# Patient Record
Sex: Female | Born: 1935 | Race: Black or African American | Hispanic: No | Smoking: Former smoker
Health system: Southern US, Community
[De-identification: ages and names within clinical notes are randomized; demographics above are authoritative.]

## PROBLEM LIST (undated history)

## (undated) DIAGNOSIS — E785 Hyperlipidemia, unspecified: Secondary | ICD-10-CM

## (undated) DIAGNOSIS — A159 Respiratory tuberculosis unspecified: Secondary | ICD-10-CM

## (undated) DIAGNOSIS — I219 Acute myocardial infarction, unspecified: Secondary | ICD-10-CM

## (undated) DIAGNOSIS — I10 Essential (primary) hypertension: Secondary | ICD-10-CM

## (undated) HISTORY — DX: Hyperlipidemia, unspecified: E78.5

## (undated) HISTORY — DX: Essential (primary) hypertension: I10

## (undated) HISTORY — PX: CARDIAC DEFIBRILLATOR PLACEMENT: SHX171

## (undated) HISTORY — DX: Respiratory tuberculosis unspecified: A15.9

## (undated) HISTORY — DX: Acute myocardial infarction, unspecified: I21.9

---

## 2000-02-20 HISTORY — PX: CORONARY ARTERY BYPASS GRAFT: SHX141

## 2010-11-09 ENCOUNTER — Encounter: Payer: Self-pay | Admitting: Internal Medicine

## 2010-11-09 ENCOUNTER — Ambulatory Visit (INDEPENDENT_AMBULATORY_CARE_PROVIDER_SITE_OTHER): Payer: Medicare Other | Admitting: Internal Medicine

## 2010-11-09 VITALS — BP 110/72 | HR 60 | Temp 97.8°F | Resp 12 | Ht 63.5 in | Wt 138.0 lb

## 2010-11-09 DIAGNOSIS — I2510 Atherosclerotic heart disease of native coronary artery without angina pectoris: Secondary | ICD-10-CM | POA: Insufficient documentation

## 2010-11-09 DIAGNOSIS — H04123 Dry eye syndrome of bilateral lacrimal glands: Secondary | ICD-10-CM

## 2010-11-09 DIAGNOSIS — H04129 Dry eye syndrome of unspecified lacrimal gland: Secondary | ICD-10-CM

## 2010-11-09 DIAGNOSIS — Z23 Encounter for immunization: Secondary | ICD-10-CM

## 2010-11-09 DIAGNOSIS — E785 Hyperlipidemia, unspecified: Secondary | ICD-10-CM | POA: Insufficient documentation

## 2010-11-09 DIAGNOSIS — I10 Essential (primary) hypertension: Secondary | ICD-10-CM | POA: Insufficient documentation

## 2010-11-09 DIAGNOSIS — Z0000 Encounter for general adult medical examination without abnormal findings: Secondary | ICD-10-CM

## 2010-11-09 NOTE — Progress Notes (Signed)
Subjective:    Patient ID: Sally Noble, female    DOB: August 10, 1923, 77 y.o.   MRN: [id number]  HPI Sally Noble is an 77 year old female with a history of hypertension, hyperlipidemia, and coronary artery disease who presents to establish care. She reports that she has been feeling well. She reports normal appetite and normal activity level. Her one concern today is drainage from both of her eyes. She reports it's been going on for several years. She has clear drainage from both of her eyes and decreased vision in her left eye. She recently moved to [place] would like to establish care with an ophthalmologist.  Outpatient Encounter Prescriptions as of 11/09/2010  Medication Sig Dispense Refill  . atorvastatin (LIPITOR) 80 MG tablet Take 80 mg by mouth daily.        Marland Kitchen FOLIC ACID PO Take 1 tablet by mouth daily.        Bernadette Hoit Sodium (SENNA PLUS PO) Take 1 tablet by mouth as needed.        Marland Kitchen acetaminophen-codeine (TYLENOL #3) 300-30 MG per tablet 1 tablet every 4 (four) hours as needed.       . donepezil (ARICEPT) 5 MG tablet Take 5 mg by mouth at bedtime.       . furosemide (LASIX) 40 MG tablet Take 40 mg by mouth daily.       Marland Kitchen lisinopril (PRINIVIL,ZESTRIL) 20 MG tablet Take 20 mg by mouth daily.       . metoprolol succinate (TOPROL-XL) 25 MG 24 hr tablet Take 25 mg by mouth daily.       Marland Kitchen NITROSTAT 0.4 MG SL tablet Place 0.4 mg under the tongue every 5 (five) minutes as needed.       Marland Kitchen omeprazole (PRILOSEC) 20 MG capsule Take 20 mg by mouth 2 (two) times daily.       Marland Kitchen spironolactone (ALDACTONE) 25 MG tablet Take 25 mg by mouth daily.         Review of Systems  Constitutional: Negative for fever, chills, appetite change, fatigue and unexpected weight change.  HENT: Negative for ear pain, congestion, sore throat, trouble swallowing, neck pain, voice change and sinus pressure.   Eyes: Positive for discharge and visual disturbance.  Respiratory: Negative for cough, shortness  of breath, wheezing and stridor.   Cardiovascular: Negative for chest pain, palpitations and leg swelling.  Gastrointestinal: Negative for nausea, vomiting, abdominal pain, diarrhea, constipation, blood in stool, abdominal distention and anal bleeding.  Genitourinary: Negative for dysuria and flank pain.  Musculoskeletal: Negative for myalgias, arthralgias and gait problem.  Skin: Negative for color change and rash.  Neurological: Negative for dizziness and headaches.  Hematological: Negative for adenopathy. Does not bruise/bleed easily.  Psychiatric/Behavioral: Negative for suicidal ideas, sleep disturbance and dysphoric mood. The patient is not nervous/anxious.    BP 110/72  Pulse 60  Temp(Src) 97.8 F (36.6 C) (Oral)  Resp 12  Ht 5' 3.5" (1.613 m)  Wt 138 lb (62.596 kg)  BMI 24.06 kg/m2  SpO2 98%     Objective:   Physical Exam  Constitutional: She is oriented to person, place, and time. She appears well-developed and well-nourished. No distress.  HENT:  Head: Normocephalic and atraumatic.  Right Ear: External ear normal.  Left Ear: External ear normal.  Nose: Nose normal.  Mouth/Throat: Oropharynx is clear and moist. No oropharyngeal exudate.  Eyes: Conjunctivae are normal. Right eye exhibits no discharge. Left eye exhibits discharge. No scleral icterus. Left eye exhibits abnormal extraocular  motion. Left pupil is not reactive. Pupils are unequal.    Neck: Normal range of motion. Neck supple. No tracheal deviation present. No thyromegaly present.  Cardiovascular: Normal rate, regular rhythm, normal heart sounds and intact distal pulses.  Exam reveals no gallop and no friction rub.   No murmur heard. Pulmonary/Chest: Effort normal and breath sounds normal. No respiratory distress. She has no wheezes. She has no rales. She exhibits no tenderness.  Abdominal: Soft. Bowel sounds are normal. She exhibits no distension and no mass. There is no tenderness. There is no rebound and no  guarding.  Musculoskeletal: Normal range of motion. She exhibits no edema and no tenderness.  Lymphadenopathy:    She has no cervical adenopathy.  Neurological: She is alert and oriented to person, place, and time. No cranial nerve deficit. She exhibits normal muscle tone. Coordination normal.  Skin: Skin is warm and dry. No rash noted. She is not diaphoretic. No erythema. No pallor.  Psychiatric: She has a normal mood and affect. Her behavior is normal. Judgment and thought content normal.          Assessment & Plan:  1. Eye Drainage and decreased vision in left eye - patient with bilateral clear eye drainage and decreased vision in her left eye. Exam is remarkable for nonreactive pupil on the left. Will request records on her history as to previous evaluation and treatment. She has no pain or photophobia to suggest corneal abrasion. Her conjunctiva are clear. Will set her up with ophthalmology for evaluation and management.  2. Hypertension - patient with history of hypertension. Blood pressure is well-controlled today. Will get records on previous treatment. We'll check renal function with labs today.  3. Hyperlipidemia - patient with history of hyperlipidemia on statin. Typically I would refrain from using a statin and the patient who is 77 years old. However, she reports extensive history of atherosclerosis and coronary artery disease. Will obtain her previous records on evaluation and management, prior to making a decision about long-term statin use. Will check lipids and LFTs with labs today.  4. Coronary artery disease -patient with coronary artery disease with a history of implantable defibrillator. Will obtain records on previous evaluation and management. Will continue current medications. Notably, she is not on aspirin or plavix. Unclear reason for this. Will review records, if no contraindiation, will start aspirin.

## 2010-11-10 ENCOUNTER — Encounter: Payer: Self-pay | Admitting: Internal Medicine

## 2010-11-10 LAB — CBC WITH DIFFERENTIAL/PLATELET
Basophils Relative: 0.5 % (ref 0.0–3.0)
Eosinophils Relative: 1.8 % (ref 0.0–5.0)
HCT: 43 % (ref 36.0–46.0)
Hemoglobin: 14.2 g/dL (ref 12.0–15.0)
Lymphocytes Relative: 18.7 % (ref 12.0–46.0)
Lymphs Abs: 1.2 10*3/uL (ref 0.7–4.0)
Monocytes Relative: 5.7 % (ref 3.0–12.0)
Neutro Abs: 4.9 10*3/uL (ref 1.4–7.7)
RBC: 4.84 Mil/uL (ref 3.87–5.11)
WBC: 6.7 10*3/uL (ref 4.5–10.5)

## 2010-11-10 LAB — HEPATIC FUNCTION PANEL
ALT: 15 U/L (ref 0–35)
Bilirubin, Direct: 0.1 mg/dL (ref 0.0–0.3)
Total Bilirubin: 0.4 mg/dL (ref 0.3–1.2)
Total Protein: 7.7 g/dL (ref 6.0–8.3)

## 2010-11-10 LAB — BASIC METABOLIC PANEL
BUN: 27 mg/dL — ABNORMAL HIGH (ref 6–23)
Calcium: 9.3 mg/dL (ref 8.4–10.5)
Chloride: 102 mEq/L (ref 96–112)
Creatinine, Ser: 0.9 mg/dL (ref 0.4–1.2)

## 2010-11-10 LAB — LIPID PANEL
Cholesterol: 160 mg/dL (ref 0–200)
HDL: 40 mg/dL (ref >39.00)
Total CHOL/HDL Ratio: 4
Triglycerides: 227 mg/dL — ABNORMAL HIGH (ref 0.0–149.0)

## 2010-11-10 NOTE — Progress Notes (Signed)
Addended by: Melody Comas L on: 11/10/2010 11:06 AM   Modules accepted: Orders

## 2010-11-10 NOTE — Progress Notes (Signed)
Addended by: Jobie Quaker on: 11/10/2010 10:56 AM   Modules accepted: Orders

## 2010-11-28 ENCOUNTER — Emergency Department: Payer: 59 | Admitting: Emergency Medicine

## 2010-12-01 ENCOUNTER — Encounter: Payer: Self-pay | Admitting: Internal Medicine

## 2011-01-05 ENCOUNTER — Telehealth: Payer: Self-pay | Admitting: Internal Medicine

## 2011-01-05 NOTE — Telephone Encounter (Signed)
Received message from [facility], that pt nose running. May start Claritin 10mg  daily. Follow up here if no improvement

## 2011-01-05 NOTE — Telephone Encounter (Signed)
Notified the [facility] and also faxed copy of this phone note to the [facility] at there request.

## 2011-01-10 ENCOUNTER — Ambulatory Visit: Payer: Medicare Other | Admitting: Internal Medicine

## 2011-01-15 ENCOUNTER — Ambulatory Visit: Payer: Medicare Other | Admitting: Internal Medicine

## 2011-03-08 ENCOUNTER — Encounter: Payer: Self-pay | Admitting: Cardiovascular Disease

## 2011-03-08 ENCOUNTER — Inpatient Hospital Stay: Payer: Self-pay | Admitting: Internal Medicine

## 2011-03-08 LAB — CBC
HGB: 11 g/dL — ABNORMAL LOW (ref 12.0–16.0)
MCH: 30.8 pg (ref 26.0–34.0)
MCHC: 33 g/dL (ref 32.0–36.0)
Platelet: 159 10*3/uL (ref 150–440)
RBC: 3.56 10*6/uL — ABNORMAL LOW (ref 3.80–5.20)
WBC: 6.4 10*3/uL (ref 3.6–11.0)

## 2011-03-08 LAB — COMPREHENSIVE METABOLIC PANEL
Albumin: 3.3 g/dL — ABNORMAL LOW (ref 3.4–5.0)
Calcium, Total: 8.3 mg/dL — ABNORMAL LOW (ref 8.5–10.1)
Co2: 19 mmol/L — ABNORMAL LOW (ref 21–32)
Glucose: 99 mg/dL (ref 65–99)
Osmolality: 313 (ref 275–301)
Potassium: 6.7 mmol/L (ref 3.5–5.1)
SGOT(AST): 20 U/L (ref 15–37)
Sodium: 141 mmol/L (ref 136–145)

## 2011-03-08 LAB — URINALYSIS, COMPLETE
Bilirubin,UR: NEGATIVE
Blood: NEGATIVE
Ketone: NEGATIVE
Leukocyte Esterase: NEGATIVE
Protein: NEGATIVE
Specific Gravity: 1.008 (ref 1.003–1.030)
Squamous Epithelial: 2
WBC UR: <1 /HPF (ref 0–5)

## 2011-03-08 LAB — CK TOTAL AND CKMB (NOT AT ARMC)
CK, Total: 61 U/L (ref 21–215)
CK-MB: 2.5 ng/mL (ref 0.5–3.6)

## 2011-03-09 ENCOUNTER — Encounter: Payer: Self-pay | Admitting: Cardiovascular Disease

## 2011-03-09 DIAGNOSIS — I059 Rheumatic mitral valve disease, unspecified: Secondary | ICD-10-CM

## 2011-03-09 DIAGNOSIS — R072 Precordial pain: Secondary | ICD-10-CM

## 2011-03-09 LAB — BASIC METABOLIC PANEL
Anion Gap: 10 (ref 7–16)
Anion Gap: 16 (ref 7–16)
BUN: 68 mg/dL — ABNORMAL HIGH (ref 7–18)
Calcium, Total: 8.2 mg/dL — ABNORMAL LOW (ref 8.5–10.1)
Calcium, Total: 7.7 mg/dL — ABNORMAL LOW (ref 8.5–10.1)
Co2: 20 mmol/L — ABNORMAL LOW (ref 21–32)
Co2: 17 mmol/L — ABNORMAL LOW (ref 21–32)
Creatinine: 2.04 mg/dL — ABNORMAL HIGH (ref 0.60–1.30)
Creatinine: 1.96 mg/dL — ABNORMAL HIGH (ref 0.60–1.30)
Creatinine: 1.77 mg/dL — ABNORMAL HIGH (ref 0.60–1.30)
EGFR (African American): 30 — ABNORMAL LOW
EGFR (African American): 31 — ABNORMAL LOW
EGFR (African American): 35 — ABNORMAL LOW
EGFR (Non-African Amer.): 25 — ABNORMAL LOW
EGFR (Non-African Amer.): 26 — ABNORMAL LOW
EGFR (Non-African Amer.): 29 — ABNORMAL LOW
Glucose: 141 mg/dL — ABNORMAL HIGH (ref 65–99)
Osmolality: 308 (ref 275–301)
Potassium: 4 mmol/L (ref 3.5–5.1)
Sodium: 150 mmol/L — ABNORMAL HIGH (ref 136–145)
Sodium: 145 mmol/L (ref 136–145)

## 2011-03-09 LAB — CBC WITH DIFFERENTIAL/PLATELET
Basophil %: 0 %
Eosinophil #: 0 10*3/uL (ref 0.0–0.7)
Eosinophil %: 0.3 %
HGB: 10.8 g/dL — ABNORMAL LOW (ref 12.0–16.0)
MCH: 31.1 pg (ref 26.0–34.0)
MCHC: 33.3 g/dL (ref 32.0–36.0)
Monocyte #: 0.1 10*3/uL (ref 0.0–0.7)
Neutrophil #: 9 10*3/uL — ABNORMAL HIGH (ref 1.4–6.5)
Neutrophil %: 96.3 %
Platelet: 151 10*3/uL (ref 150–440)

## 2011-03-09 LAB — IRON AND TIBC
Iron Bind.Cap.(Total): 248 ug/dL — ABNORMAL LOW (ref 250–450)
Iron Saturation: 27 %
Iron: 67 ug/dL (ref 50–170)
Unbound Iron-Bind.Cap.: 181 ug/dL

## 2011-03-09 LAB — APTT: Activated PTT: 36 secs — ABNORMAL HIGH (ref 23.6–35.9)

## 2011-03-09 LAB — LIPID PANEL
Cholesterol: 115 mg/dL (ref 0–200)
Ldl Cholesterol, Calc: 63 mg/dL (ref 0–100)
Triglycerides: 135 mg/dL (ref 0–200)

## 2011-03-09 LAB — CK TOTAL AND CKMB (NOT AT ARMC): CK, Total: 59 U/L (ref 21–215)

## 2011-03-09 LAB — MAGNESIUM: Magnesium: 1.4 mg/dL — ABNORMAL LOW

## 2011-03-09 LAB — FERRITIN: Ferritin (ARMC): 394 ng/mL — ABNORMAL HIGH (ref 8–388)

## 2011-03-09 LAB — HEMOGLOBIN A1C: Hemoglobin A1C: 6.5 % — ABNORMAL HIGH (ref 4.2–6.3)

## 2011-03-10 ENCOUNTER — Encounter: Payer: Self-pay | Admitting: Cardiovascular Disease

## 2011-03-10 LAB — RENAL FUNCTION PANEL
Albumin: 2.4 g/dL — ABNORMAL LOW (ref 3.4–5.0)
Anion Gap: 13 (ref 7–16)
BUN: 38 mg/dL — ABNORMAL HIGH (ref 7–18)
Calcium, Total: 7.2 mg/dL — ABNORMAL LOW (ref 8.5–10.1)
Chloride: 113 mmol/L — ABNORMAL HIGH (ref 98–107)
Co2: 19 mmol/L — ABNORMAL LOW (ref 21–32)
Creatinine: 1.37 mg/dL — ABNORMAL HIGH (ref 0.60–1.30)
EGFR (African American): 47 — ABNORMAL LOW
Osmolality: 302 (ref 275–301)
Potassium: 3.2 mmol/L — ABNORMAL LOW (ref 3.5–5.1)

## 2011-03-10 LAB — BASIC METABOLIC PANEL
Co2: 21 mmol/L (ref 21–32)
Creatinine: 1.18 mg/dL (ref 0.60–1.30)
EGFR (African American): 56 — ABNORMAL LOW
EGFR (Non-African Amer.): 46 — ABNORMAL LOW
Glucose: 250 mg/dL — ABNORMAL HIGH (ref 65–99)
Sodium: 141 mmol/L (ref 136–145)

## 2011-03-10 LAB — APTT: Activated PTT: >160 secs (ref 23.6–35.9)

## 2011-03-11 ENCOUNTER — Encounter: Payer: Self-pay | Admitting: Cardiovascular Disease

## 2011-03-11 LAB — RENAL FUNCTION PANEL
Albumin: 2.2 g/dL — ABNORMAL LOW (ref 3.4–5.0)
Chloride: 111 mmol/L — ABNORMAL HIGH (ref 98–107)
Co2: 21 mmol/L (ref 21–32)
Creatinine: 1.06 mg/dL (ref 0.60–1.30)
EGFR (Non-African Amer.): 52 — ABNORMAL LOW
Phosphorus: 1.9 mg/dL — ABNORMAL LOW (ref 2.5–4.9)

## 2011-03-12 LAB — TSH: Thyroid Stimulating Horm: 1.44 u[IU]/mL

## 2011-03-12 LAB — BASIC METABOLIC PANEL
Anion Gap: 10 (ref 7–16)
BUN: 14 mg/dL (ref 7–18)
Calcium, Total: 8.1 mg/dL — ABNORMAL LOW (ref 8.5–10.1)
Creatinine: 0.8 mg/dL (ref 0.60–1.30)
EGFR (African American): >60
EGFR (Non-African Amer.): >60
Glucose: 85 mg/dL (ref 65–99)
Osmolality: 288 (ref 275–301)
Potassium: 3.8 mmol/L (ref 3.5–5.1)

## 2011-03-13 LAB — BASIC METABOLIC PANEL
Anion Gap: 11 (ref 7–16)
BUN: 11 mg/dL (ref 7–18)
Chloride: 111 mmol/L — ABNORMAL HIGH (ref 98–107)
Co2: 26 mmol/L (ref 21–32)
Creatinine: 0.87 mg/dL (ref 0.60–1.30)
EGFR (African American): >60
EGFR (Non-African Amer.): >60
Osmolality: 293 (ref 275–301)
Sodium: 148 mmol/L — ABNORMAL HIGH (ref 136–145)

## 2011-03-13 LAB — PLATELET COUNT: Platelet: 158 10*3/uL (ref 150–440)

## 2011-03-13 LAB — CULTURE, BLOOD (SINGLE)

## 2011-03-14 ENCOUNTER — Telehealth: Payer: Self-pay | Admitting: *Deleted

## 2011-03-14 NOTE — Telephone Encounter (Signed)
Pt was d/c'd from hospital yesterday. She was admitted for primary c/o CP. Pt is scheduled for OV for f/u Monday the 28th. The Sally Noble is aware that it is important to keep f/u and will call office w/any problems.

## 2011-03-19 ENCOUNTER — Ambulatory Visit (INDEPENDENT_AMBULATORY_CARE_PROVIDER_SITE_OTHER): Payer: Medicare Other | Admitting: Internal Medicine

## 2011-03-19 ENCOUNTER — Encounter: Payer: Self-pay | Admitting: Internal Medicine

## 2011-03-19 VITALS — BP 120/76 | HR 81 | Temp 98.2°F | Ht 63.5 in | Wt 139.0 lb

## 2011-03-19 DIAGNOSIS — I10 Essential (primary) hypertension: Secondary | ICD-10-CM

## 2011-03-19 DIAGNOSIS — I2510 Atherosclerotic heart disease of native coronary artery without angina pectoris: Secondary | ICD-10-CM

## 2011-03-19 DIAGNOSIS — R197 Diarrhea, unspecified: Secondary | ICD-10-CM | POA: Insufficient documentation

## 2011-03-19 NOTE — Assessment & Plan Note (Signed)
Likely secondary to use of senna. Patient reports that symptoms have resolved. We'll continue to monitor. Followup in one month.

## 2011-03-19 NOTE — Assessment & Plan Note (Signed)
Blood pressure well-controlled today. Medication changes made in hospital noted. We'll continue to monitor closely. Followup here in one month.

## 2011-03-19 NOTE — Progress Notes (Signed)
Subjective:    Patient ID: Sally Noble, female    DOB: March 07, 1923, 78 y.o.   MRN: [id number]  HPI 78 year old female with history of coronary artery disease, hypertension, hyperlipidemia presents for followup after recent hospitalization for chest pain and diarrhea. During her hospitalization her medications were changed and she was taken off beta blocker and Spironolactone. Lasix was also stopped. She was evaluated for chest pain, however workup was negative for any new myocardial infarction. She was also evaluated for diarrhea and dehydration. Her senna was stopped. She had significant improvement in her diarrhea. She reports that since hospital discharge, she has had no further episodes of chest pain, shortness of breath, or palpitations. She also reports no further episodes of diarrhea. She reports that she has had 3 bowel movements since her discharge which she describes as normal. She denies any abdominal pain. She denies any blood in her stool. She denies any fever or chills. She reports that she is generally feeling well.  Outpatient Encounter Prescriptions as of 03/19/2011  Medication Sig Dispense Refill  . acetaminophen-codeine (TYLENOL #3) 300-30 MG per tablet 1 tablet every 4 (four) hours as needed.       Marland Kitchen atorvastatin (LIPITOR) 80 MG tablet Take 80 mg by mouth daily.        Marland Kitchen donepezil (ARICEPT) 5 MG tablet Take 5 mg by mouth at bedtime.       Marland Kitchen NITROSTAT 0.4 MG SL tablet Place 0.4 mg under the tongue every 5 (five) minutes as needed.       Marland Kitchen omeprazole (PRILOSEC) 20 MG capsule Take 20 mg by mouth 2 (two) times daily.       Marland Kitchen DISCONTD: FOLIC ACID PO Take 1 tablet by mouth daily.        Marland Kitchen lisinopril (PRINIVIL,ZESTRIL) 20 MG tablet Take 20 mg by mouth daily.         Review of Systems  Constitutional: Negative for fever, chills, appetite change, fatigue and unexpected weight change.  HENT: Negative for ear pain, congestion, sore throat, trouble swallowing, neck pain, voice change and  sinus pressure.   Eyes: Negative for visual disturbance.  Respiratory: Negative for cough, shortness of breath, wheezing and stridor.   Cardiovascular: Negative for chest pain, palpitations and leg swelling.  Gastrointestinal: Negative for nausea, vomiting, abdominal pain, diarrhea, constipation, blood in stool, abdominal distention and anal bleeding.  Genitourinary: Negative for dysuria and flank pain.  Musculoskeletal: Negative for myalgias, arthralgias and gait problem.  Skin: Negative for color change and rash.  Neurological: Negative for dizziness and headaches.  Hematological: Negative for adenopathy. Does not bruise/bleed easily.  Psychiatric/Behavioral: Negative for suicidal ideas, sleep disturbance and dysphoric mood. The patient is not nervous/anxious.    BP 120/76  Pulse 81  Temp(Src) 98.2 F (36.8 C) (Oral)  Ht 5' 3.5" (1.613 m)  Wt 139 lb (63.05 kg)  BMI 24.24 kg/m2  SpO2 98%     Objective:   Physical Exam  Constitutional: She is oriented to person, place, and time. She appears well-developed and well-nourished. No distress.  HENT:  Head: Normocephalic and atraumatic.  Right Ear: External ear normal.  Left Ear: External ear normal.  Nose: Nose normal.  Mouth/Throat: Oropharynx is clear and moist. No oropharyngeal exudate.  Eyes: Conjunctivae are normal. Pupils are equal, round, and reactive to light. Right eye exhibits no discharge. Left eye exhibits no discharge. No scleral icterus.  Neck: Normal range of motion. Neck supple. No tracheal deviation present. No thyromegaly present.  Cardiovascular: Normal  rate, regular rhythm, normal heart sounds and intact distal pulses.  Exam reveals no gallop and no friction rub.   No murmur heard. Pulmonary/Chest: Effort normal and breath sounds normal. No respiratory distress. She has no wheezes. She has no rales. She exhibits no tenderness.  Musculoskeletal: Normal range of motion. She exhibits no edema and no tenderness.    Lymphadenopathy:    She has no cervical adenopathy.  Neurological: She is alert and oriented to person, place, and time. No cranial nerve deficit. She exhibits normal muscle tone. Coordination normal.  Skin: Skin is warm and dry. No rash noted. She is not diaphoretic. No erythema. No pallor.  Psychiatric: She has a normal mood and affect. Her behavior is normal. Judgment and thought content normal.          Assessment & Plan:

## 2011-03-19 NOTE — Assessment & Plan Note (Signed)
Status post recent hospital admission for chest pain. Will request recent discharge summary. Patient has been chest pain-free since hospital discharge. We'll continue to monitor closely. Plan to continue current medications. Followup here in one month.

## 2011-03-21 ENCOUNTER — Telehealth: Payer: Self-pay | Admitting: Internal Medicine

## 2011-03-21 NOTE — Telephone Encounter (Addendum)
Viviann Spare called from [facility] needing an order for this patients therapy . He also called to let us now that the patient has had a 10 lb. Weight gain since January 16,2013 her blood pressure was reading 138/90 and her ankles were swollen . Dr.Walker O' kd the Rehab and a follow up appointment was scheduled for February 1,2013 @ 10:45 with Dr. Dan Humphreys. An order was faxed to [facility] to give the patient Lasix 20 mg once a day over 2 days per Dr. Dan Humphreys. Patient informed Viviann Spare that her diarrhea has stopped since she was taken off the medication.

## 2011-03-23 ENCOUNTER — Ambulatory Visit (INDEPENDENT_AMBULATORY_CARE_PROVIDER_SITE_OTHER): Payer: Medicare Other | Admitting: Internal Medicine

## 2011-03-23 ENCOUNTER — Encounter: Payer: Self-pay | Admitting: Internal Medicine

## 2011-03-23 VITALS — BP 122/78 | HR 65 | Temp 97.9°F | Wt 135.0 lb

## 2011-03-23 DIAGNOSIS — I509 Heart failure, unspecified: Secondary | ICD-10-CM

## 2011-03-23 LAB — BASIC METABOLIC PANEL
BUN: 12 mg/dL (ref 6–23)
CO2: 27 mEq/L (ref 19–32)
Calcium: 8.8 mg/dL (ref 8.4–10.5)
Chloride: 107 mEq/L (ref 96–112)
Creatinine, Ser: 1 mg/dL (ref 0.4–1.2)
GFR: 69.35 mL/min (ref >60.00)
Glucose, Bld: 99 mg/dL (ref 70–99)
Potassium: 3.6 mEq/L (ref 3.5–5.1)
Sodium: 142 mEq/L (ref 135–145)

## 2011-03-23 NOTE — Progress Notes (Signed)
Subjective:    Patient ID: Sally Noble, female    DOB: [birth year], 78 y.o.   MRN: [id number]  HPI 78 year old female with a history of congestive heart failure status post recent admission for dehydration and diarrhea presents for followup. She was recently seen on 03/18/2010. She was doing very well at time. She was then undergoing physical therapy session on Wednesday, 03/20/2010 when she was noted to have approximately 10 pound weight gain and pitting edema in her lower extremities. Physical therapist called our office and we started her back on Lasix 20 mg daily for 2 days. She reports improvement in her lower extremity swelling with this medication. She denies any shortness of breath, chest pain, or other complaints.  Outpatient Encounter Prescriptions as of 03/23/2011  Medication Sig Dispense Refill  . acetaminophen-codeine (TYLENOL #3) 300-30 MG per tablet 1 tablet every 4 (four) hours as needed.       Marland Kitchen atorvastatin (LIPITOR) 80 MG tablet Take 80 mg by mouth daily.        Marland Kitchen donepezil (ARICEPT) 5 MG tablet Take 5 mg by mouth at bedtime.       Marland Kitchen lisinopril (PRINIVIL,ZESTRIL) 20 MG tablet Take 20 mg by mouth daily.       Marland Kitchen NITROSTAT 0.4 MG SL tablet Place 0.4 mg under the tongue every 5 (five) minutes as needed.       Marland Kitchen omeprazole (PRILOSEC) 20 MG capsule Take 20 mg by mouth 2 (two) times daily.         Review of Systems  Constitutional: Negative for fever, chills, appetite change, fatigue and unexpected weight change.  HENT: Negative for ear pain, congestion, sore throat, trouble swallowing, neck pain, voice change and sinus pressure.   Eyes: Negative for visual disturbance.  Respiratory: Negative for cough, shortness of breath, wheezing and stridor.   Cardiovascular: Positive for leg swelling. Negative for chest pain and palpitations.  Gastrointestinal: Negative for nausea, vomiting, abdominal pain, diarrhea, constipation, blood in stool, abdominal distention and anal bleeding.    Genitourinary: Negative for dysuria and flank pain.  Musculoskeletal: Negative for myalgias, arthralgias and gait problem.  Skin: Negative for color change and rash.  Neurological: Negative for dizziness and headaches.  Hematological: Negative for adenopathy. Does not bruise/bleed easily.  Psychiatric/Behavioral: Negative for suicidal ideas, sleep disturbance and dysphoric mood. The patient is not nervous/anxious.    BP 122/78  Pulse 65  Temp(Src) 97.9 F (36.6 C) (Oral)  Wt 135 lb (61.236 kg)  SpO2 97%     Objective:   Physical Exam  Constitutional: She is oriented to person, place, and time. She appears well-developed and well-nourished. No distress.  HENT:  Head: Normocephalic and atraumatic.  Right Ear: External ear normal.  Left Ear: External ear normal.  Nose: Nose normal.  Mouth/Throat: Oropharynx is clear and moist. No oropharyngeal exudate.  Eyes: Conjunctivae are normal. Pupils are equal, round, and reactive to light. Right eye exhibits no discharge. Left eye exhibits no discharge. No scleral icterus.  Neck: Normal range of motion. Neck supple. No tracheal deviation present. No thyromegaly present.  Cardiovascular: Normal rate, regular rhythm, normal heart sounds and intact distal pulses.  Exam reveals no gallop and no friction rub.   No murmur heard. Pulmonary/Chest: Effort normal. No respiratory distress. She has no wheezes. She has rales (few at bilateral bases). She exhibits no tenderness.  Musculoskeletal: Normal range of motion. She exhibits edema (1+ to mid lower leg). She exhibits no tenderness.  Lymphadenopathy:    She has  no cervical adenopathy.  Neurological: She is alert and oriented to person, place, and time. No cranial nerve deficit. She exhibits normal muscle tone. Coordination normal.  Skin: Skin is warm and dry. No rash noted. She is not diaphoretic. No erythema. No pallor.  Psychiatric: She has a normal mood and affect. Her behavior is normal. Judgment  and thought content normal.          Assessment & Plan:

## 2011-03-23 NOTE — Assessment & Plan Note (Signed)
Concern was raised by PT earlier this week about 10 pound weight gain and lower extremity edema. However, based on our records patient's weight is stable and now down by 4 pounds since Monday. She has minimal edema in her lower extremities. She reports this is at her baseline. I reviewed her records from her recent admission at [facility]. At that time, she was noted to be dehydrated with acute renal insufficiency. She also had hyperkalemia. She was taken off her spironolactone and her Lasix. We discussed the option of resuming her Lasix on a daily basis versus continuing to monitor her weight and lower extremity edema and using as needed. She would prefer to use as needed at this time. Will check renal function and electrolytes with labs today. She has followup with cardiology next week. She will followup here in one month.

## 2011-03-23 NOTE — Patient Instructions (Signed)
Please monitor weight and blood pressure daily.  If weight increases >3lb in 24hr, please notify us.  If BP consistently >140/90, please notify us.  Follow up 1 month.

## 2011-03-28 ENCOUNTER — Encounter: Payer: Self-pay | Admitting: Cardiovascular Disease

## 2011-03-28 ENCOUNTER — Ambulatory Visit (INDEPENDENT_AMBULATORY_CARE_PROVIDER_SITE_OTHER): Payer: Medicare Other | Admitting: Cardiovascular Disease

## 2011-03-28 VITALS — BP 144/89 | HR 73 | Ht 63.0 in | Wt 133.0 lb

## 2011-03-28 DIAGNOSIS — E785 Hyperlipidemia, unspecified: Secondary | ICD-10-CM

## 2011-03-28 DIAGNOSIS — I10 Essential (primary) hypertension: Secondary | ICD-10-CM

## 2011-03-28 DIAGNOSIS — I2510 Atherosclerotic heart disease of native coronary artery without angina pectoris: Secondary | ICD-10-CM

## 2011-03-28 DIAGNOSIS — I509 Heart failure, unspecified: Secondary | ICD-10-CM

## 2011-03-28 DIAGNOSIS — Z95810 Presence of automatic (implantable) cardiac defibrillator: Secondary | ICD-10-CM

## 2011-03-28 DIAGNOSIS — R197 Diarrhea, unspecified: Secondary | ICD-10-CM

## 2011-03-28 NOTE — Assessment & Plan Note (Signed)
We will try to get her established in the [facility] cardiology office for ICD evaluation.

## 2011-03-28 NOTE — Assessment & Plan Note (Signed)
She reports her diarrhea has resolved. I suggested she call our office or Dr. Dan Humphreys recur.

## 2011-03-28 NOTE — Assessment & Plan Note (Signed)
Blood pressure mildly elevated. We'll start the lisinopril and low-dose carvedilol as above.

## 2011-03-28 NOTE — Assessment & Plan Note (Signed)
We'll continue her Lipitor.

## 2011-03-28 NOTE — Progress Notes (Signed)
Patient ID: Sally Noble, female    DOB: 1924-01-22, 78 y.o.   MRN: [id number]  HPI Comments: Ms. Dangerfield is an 78 year old woman with history of coronary artery disease, bypass in 2002, LIMA to the LAD, vein graft to the PDA, vein graft to the OM with cardiac catheterization August 2012 with occluded vein graft to the OM, ejection fraction estimated at 15-20%, history of dementia, hypertension, stroke, ICD who has been a resident of the [facility] in [place] since July 2012, Who presented to the hospital March 09, 2011 with chest pain, dehydration, diarrhea.  She reported having a long period of diarrhea. Other residents at the next or having diarrhea per her report. Creatinine was greater than 3. Her cardiac medications were held including diuretic, beta blocker, ACE inhibitor and spironolactone. She was hydrated, diarrhea resolved and she was discharged with followup. She did appear in the hospital to be depressed and was not eating or drinking much. She reports now that she is drinking normally and diarrhea has resolved. Creatinine had significantly improved at the time of discharge.  She is asking whether she can go home to her house in [place]. She would like to drive again. She denies any shortness of breath or chest pain. No further diarrhea.  EKG shows normal sinus rhythm with rate 73 beats per minute with poor R wave progression to the anterior precordial leads, T-wave abnormality in one and aVL   Outpatient Encounter Prescriptions as of 03/28/2011  Medication Sig Dispense Refill  . acetaminophen (TYLENOL) 500 MG tablet Take 500 mg by mouth 3 (three) times daily as needed.      Marland Kitchen aspirin EC 81 MG tablet Take 81 mg by mouth daily.      Marland Kitchen atorvastatin (LIPITOR) 80 MG tablet Take 80 mg by mouth daily.        Marland Kitchen donepezil (ARICEPT) 5 MG tablet Take 5 mg by mouth at bedtime.       . folic acid (FOLVITE) 400 MCG tablet Take 400 mcg by mouth daily.      Marland Kitchen loperamide (IMODIUM A-D) 2 MG tablet  Take 2 mg by mouth every 8 (eight) hours as needed.      Marland Kitchen NITROSTAT 0.4 MG SL tablet Place 0.4 mg under the tongue every 5 (five) minutes as needed.       Marland Kitchen omeprazole (PRILOSEC) 20 MG capsule Take 20 mg by mouth 2 (two) times daily.       . carvedilol (COREG) 3.125 MG tablet Take 1 tablet (3.125 mg total) by mouth 2 (two) times daily.  60 tablet  11  . furosemide (LASIX) 20 MG tablet Take 1 tablet (20 mg total) by mouth daily.  30 tablet  11  . lisinopril (PRINIVIL,ZESTRIL) 20 MG tablet Take 1 tablet (20 mg total) by mouth daily.  30 tablet  11  . spironolactone (ALDACTONE) 25 MG tablet Take 1 tablet (25 mg total) by mouth daily.        Review of Systems  Constitutional: Negative.   HENT: Negative.   Eyes: Negative.   Respiratory: Negative.   Cardiovascular: Negative.   Gastrointestinal: Negative.   Musculoskeletal: Negative.   Skin: Negative.   Neurological: Negative.   Hematological: Negative.   Psychiatric/Behavioral: Negative.   All other systems reviewed and are negative.      Physical Exam  Nursing note and vitals reviewed. Constitutional: She is oriented to person, place, and time. She appears well-developed and well-nourished.  HENT:  Head: Normocephalic.  Nose: Nose  normal.  Mouth/Throat: Oropharynx is clear and moist.  Eyes: Conjunctivae are normal. Pupils are equal, round, and reactive to light.  Neck: Normal range of motion. Neck supple. No JVD present.  Cardiovascular: Normal rate, regular rhythm, S1 normal, S2 normal and intact distal pulses.  Exam reveals no gallop and no friction rub.   Murmur heard.  Crescendo systolic murmur is present with a grade of 2/6  Pulmonary/Chest: Effort normal and breath sounds normal. No respiratory distress. She has no wheezes. She has no rales. She exhibits no tenderness.  Abdominal: Soft. Bowel sounds are normal. She exhibits no distension. There is no tenderness.  Musculoskeletal: Normal range of motion. She exhibits no  edema and no tenderness.  Lymphadenopathy:    She has no cervical adenopathy.  Neurological: She is alert and oriented to person, place, and time. Coordination normal.  Skin: Skin is warm and dry. No rash noted. No erythema.  Psychiatric: She has a normal mood and affect. Her behavior is normal. Judgment and thought content normal.         Assessment and Plan

## 2011-03-28 NOTE — Assessment & Plan Note (Signed)
Currently with no symptoms of angina. No further workup at this time. Continue current medication regimen. Underlying ischemic cardiomyopathy 

## 2011-03-28 NOTE — Patient Instructions (Addendum)
Please start coreg 3.125 mg po BID Restart lisinopril 20 mg daily Start furosemide 20 mg daily Restart spironolactone 25 mg daily  Continue your other medications  Please call us if you have new issues that need to be addressed before your next appt.  Your physician wants you to follow-up in: 3  To 4 weeks

## 2011-03-28 NOTE — Assessment & Plan Note (Signed)
History of ischemic cardiomyopathy,  Ejection fraction recently in the hospital was 35%. All of her medications were held and we will restart lisinopril 20 mg daily, Lasix 20 mg daily, Coreg 3.125 mg b.i.d. And spironolactone 25 mg daily. She does have an ICD and her carvedilol can be titrated upwards as blood pressure tolerates. The diuretic can also be titrated upwards. As needed. she was previously on 40 mg daily.

## 2011-03-30 ENCOUNTER — Telehealth: Payer: Self-pay | Admitting: Cardiovascular Disease

## 2011-03-30 ENCOUNTER — Telehealth: Payer: Self-pay | Admitting: *Deleted

## 2011-03-30 NOTE — Telephone Encounter (Signed)
2.8.13--pharmacy calling asking about folic acid and asa 81mg --advised pt saw dr Mariah Milling on 2/6 and he makes no mention of d/cing asa or folic acid so I TOLD PHARMACIST TO CONTINUE TO INCLUDE THESE MEDS ON HER MED LIST AND CONTINUE PROVIDING THEM--PHARMACIST AGREES--NT

## 2011-03-30 NOTE — Telephone Encounter (Signed)
Nursing facility has questions concerning her folic acid and ASA. Please call to advise

## 2011-04-10 ENCOUNTER — Telehealth: Payer: Self-pay

## 2011-04-10 NOTE — Telephone Encounter (Signed)
The patient's daughter(Brenda Rose) states the patient has her ICD checked with Dr. Joneen Roach @ [facility] in [place]. She is having ICD checked on April 24, 2011.  She had ICD checked by Dr. Julio Alm since ICD placed. The daughter would like to have the patient continue her care with Dr.Deen (cardiologist) @ [facility] in [place]. The daughter was not sure why her mother had been changed to Dr. Mariah Milling in the first place and that Dr.Deen's office is closer for the family to commute her back and forth. The daughter is the power of attorney for the patient.

## 2011-04-17 ENCOUNTER — Encounter: Payer: Self-pay | Admitting: *Deleted

## 2011-04-18 ENCOUNTER — Ambulatory Visit: Payer: Medicare Other | Admitting: Cardiovascular Disease

## 2011-09-04 ENCOUNTER — Encounter: Payer: Self-pay | Admitting: Internal Medicine

## 2011-09-04 ENCOUNTER — Ambulatory Visit (INDEPENDENT_AMBULATORY_CARE_PROVIDER_SITE_OTHER): Payer: Medicare Other | Admitting: Internal Medicine

## 2011-09-04 VITALS — BP 110/70 | HR 64 | Temp 98.2°F | Ht 63.5 in | Wt 130.8 lb

## 2011-09-04 DIAGNOSIS — I509 Heart failure, unspecified: Secondary | ICD-10-CM

## 2011-09-04 DIAGNOSIS — R252 Cramp and spasm: Secondary | ICD-10-CM

## 2011-09-04 DIAGNOSIS — D510 Vitamin B12 deficiency anemia due to intrinsic factor deficiency: Secondary | ICD-10-CM

## 2011-09-04 DIAGNOSIS — R350 Frequency of micturition: Secondary | ICD-10-CM | POA: Insufficient documentation

## 2011-09-04 DIAGNOSIS — E039 Hypothyroidism, unspecified: Secondary | ICD-10-CM

## 2011-09-04 DIAGNOSIS — R42 Dizziness and giddiness: Secondary | ICD-10-CM

## 2011-09-04 NOTE — Assessment & Plan Note (Signed)
Suspect this may be related to use of diuretics. Will check electrolytes, renal function, thyroid function, and B12 with labs today.

## 2011-09-04 NOTE — Assessment & Plan Note (Signed)
Patient reports recent admission for congestive heart failure. Will request records on this. Per previous notes, ejection fraction on echo was 35%. However this was in February 2013. For now, we'll continue current medications including lisinopril, spironolacton, Lasix, and carvedilol. Will check electrolytes and renal function with labs today. Followup in 2 weeks.

## 2011-09-04 NOTE — Progress Notes (Signed)
Subjective:    Patient ID: Sally Noble, female    DOB: May 28, 1923, 78 y.o.   MRN: [id number]  HPI 78 year old female with history of congestive heart failure presents for followup. She reports she was recently admitted to the hospital for CHF exacerbation. Records at this hospital admission are not available at the time of her visit. Since her hospitalization, she has been living at a local assisted-living facility. She is concerned about her care there. She reports that the staff seem to lack of knowledge about her medications. She is not sure she is taking the right medicines. She also notes they have not been checking her weight. She reports that she feels dizzy on occasion especially with standing. This is described as lightheadedness, not vertigo. She has not had syncope. She also feels fatigued. She denies shortness of breath or chest pain. She notes intermittent cramping in her lower extremities with "drawing up "of her feet. She reports that no lab work has been performed over the last couple of weeks.  Outpatient Encounter Prescriptions as of 09/04/2011  Medication Sig Dispense Refill  . acetaminophen (TYLENOL) 500 MG tablet Take 500 mg by mouth 3 (three) times daily as needed.      Marland Kitchen aspirin EC 81 MG tablet Take 81 mg by mouth daily.      Marland Kitchen atorvastatin (LIPITOR) 80 MG tablet Take 80 mg by mouth daily.        . carvedilol (COREG) 3.125 MG tablet Take 1 tablet (3.125 mg total) by mouth 2 (two) times daily.  60 tablet  11  . donepezil (ARICEPT) 5 MG tablet Take 5 mg by mouth at bedtime.       . folic acid (FOLVITE) 400 MCG tablet Take 400 mcg by mouth daily.      . furosemide (LASIX) 20 MG tablet Take 1 tablet (20 mg total) by mouth daily.  30 tablet  11  . lisinopril (PRINIVIL,ZESTRIL) 20 MG tablet Take 1 tablet (20 mg total) by mouth daily.  30 tablet  11  . NITROSTAT 0.4 MG SL tablet Place 0.4 mg under the tongue every 5 (five) minutes as needed.       Marland Kitchen omeprazole (PRILOSEC) 20 MG  capsule Take 20 mg by mouth 2 (two) times daily.       Marland Kitchen spironolactone (ALDACTONE) 25 MG tablet Take 1 tablet (25 mg total) by mouth daily.      Marland Kitchen loperamide (IMODIUM A-D) 2 MG tablet Take 2 mg by mouth every 8 (eight) hours as needed.        Review of Systems  Constitutional: Negative for fever, chills, appetite change, fatigue and unexpected weight change.  HENT: Negative for ear pain, congestion, sore throat, trouble swallowing, neck pain, voice change and sinus pressure.   Eyes: Negative for visual disturbance.  Respiratory: Negative for cough, shortness of breath, wheezing and stridor.   Cardiovascular: Negative for chest pain, palpitations and leg swelling.  Gastrointestinal: Negative for nausea, vomiting, abdominal pain, diarrhea, constipation, blood in stool, abdominal distention and anal bleeding.  Genitourinary: Negative for dysuria and flank pain.  Musculoskeletal: Positive for myalgias. Negative for arthralgias and gait problem.  Skin: Negative for color change and rash.  Neurological: Positive for dizziness, weakness and light-headedness. Negative for headaches.  Hematological: Negative for adenopathy. Does not bruise/bleed easily.  Psychiatric/Behavioral: Negative for suicidal ideas, disturbed wake/sleep cycle and dysphoric mood. The patient is not nervous/anxious.    BP 110/70  Pulse 64  Temp 98.2 F (36.8 C) (Oral)  Ht 5' 3.5" (1.613 m)  Wt 130 lb 12 oz (59.308 kg)  BMI 22.80 kg/m2  SpO2 96%     Objective:   Physical Exam  Constitutional: She is oriented to person, place, and time. She appears well-developed and well-nourished. No distress.  HENT:  Head: Normocephalic and atraumatic.  Right Ear: External ear normal.  Left Ear: External ear normal.  Nose: Nose normal.  Mouth/Throat: Oropharynx is clear and moist. No oropharyngeal exudate.  Eyes: Conjunctivae are normal. Pupils are equal, round, and reactive to light. Right eye exhibits no discharge. Left eye  exhibits no discharge. No scleral icterus.  Neck: Normal range of motion. Neck supple. No tracheal deviation present. No thyromegaly present.  Cardiovascular: Normal rate, regular rhythm, normal heart sounds and intact distal pulses.  Exam reveals no gallop and no friction rub.   No murmur heard. Pulmonary/Chest: Effort normal and breath sounds normal. No respiratory distress. She has no wheezes. She has no rales. She exhibits no tenderness.  Musculoskeletal: Normal range of motion. She exhibits no edema and no tenderness.  Lymphadenopathy:    She has no cervical adenopathy.  Neurological: She is alert and oriented to person, place, and time. No cranial nerve deficit. She exhibits normal muscle tone. Coordination normal.  Skin: Skin is warm and dry. No rash noted. She is not diaphoretic. No erythema. No pallor.  Psychiatric: She has a normal mood and affect. Her behavior is normal. Judgment and thought content normal.          Assessment & Plan:

## 2011-09-04 NOTE — Assessment & Plan Note (Signed)
Likely related to use of diuretics. Will check urinalysis with labs today.

## 2011-09-04 NOTE — Assessment & Plan Note (Signed)
Question if this may be related to electrolyte imbalance. Will check CMP, TSH, magnesium with labs today.

## 2011-09-06 LAB — POCT URINALYSIS DIPSTICK
Bilirubin, UA: NEGATIVE
Blood, UA: NEGATIVE
Glucose, UA: NEGATIVE
Nitrite, UA: NEGATIVE
Spec Grav, UA: 1.01

## 2011-09-12 ENCOUNTER — Other Ambulatory Visit: Payer: Self-pay | Admitting: Internal Medicine

## 2011-09-12 NOTE — Telephone Encounter (Signed)
Dr walker ordered labs on this patient after her visit last week.  She appears dehydrated an dappears to have a UTI.  Have her stop the daily furosemide and change it to every other day starting tomorrow,  And call in cipro 250 mg one tablet twice daily a 5 dys  #10 no refills.  She is a nursing home patient so these will need to be written out.

## 2011-09-13 MED ORDER — CIPROFLOXACIN HCL 250 MG PO TABS: 250.0000 mg | ORAL_TABLET | Freq: Two times a day (BID) | ORAL | Status: AC

## 2011-09-13 NOTE — Telephone Encounter (Signed)
Nursing home advised. Rx printed and faxed to [id number].

## 2011-09-25 ENCOUNTER — Ambulatory Visit: Payer: Medicare Other | Admitting: Internal Medicine

## 2011-09-26 ENCOUNTER — Encounter: Payer: Self-pay | Admitting: Internal Medicine

## 2011-09-26 ENCOUNTER — Ambulatory Visit (INDEPENDENT_AMBULATORY_CARE_PROVIDER_SITE_OTHER): Payer: Medicare Other | Admitting: Internal Medicine

## 2011-09-26 VITALS — BP 128/76 | HR 60 | Temp 97.6°F | Ht 63.5 in | Wt 131.2 lb

## 2011-09-26 DIAGNOSIS — I509 Heart failure, unspecified: Secondary | ICD-10-CM

## 2011-09-26 DIAGNOSIS — R42 Dizziness and giddiness: Secondary | ICD-10-CM

## 2011-09-26 DIAGNOSIS — I10 Essential (primary) hypertension: Secondary | ICD-10-CM

## 2011-09-26 LAB — COMPREHENSIVE METABOLIC PANEL
ALT: 15 U/L (ref 0–35)
Alkaline Phosphatase: 61 U/L (ref 39–117)
Sodium: 138 mEq/L (ref 135–145)
Total Bilirubin: 0.9 mg/dL (ref 0.3–1.2)
Total Protein: 7.5 g/dL (ref 6.0–8.3)

## 2011-09-26 NOTE — Progress Notes (Signed)
Subjective:    Patient ID: Sally Noble, female    DOB: [birth year], 78 y.o.   MRN: [id number]  HPI 78 year old female with history of congestive heart failure, hypertension, dementia presents for followup. She reports that she is generally feeling well. She does continue to have some episodes of lightheadedness when she stands quickly. However, in general she denies any persistent lightheadedness, chest pain, shortness of breath, lower extremity edema. We had tried to change her Lasix to as needed but she reports that the staff at her facility have been giving this medication to her daily.    Outpatient Encounter Prescriptions as of 09/26/2011  Medication Sig Dispense Refill  . acetaminophen (TYLENOL) 325 MG tablet Take 325 mg by mouth every 4 (four) hours as needed.      Marland Kitchen aspirin EC 81 MG tablet Take 81 mg by mouth daily.      Marland Kitchen atorvastatin (LIPITOR) 80 MG tablet Take 80 mg by mouth daily.        . carvedilol (COREG) 3.125 MG tablet Take 1 tablet (3.125 mg total) by mouth 2 (two) times daily.  60 tablet  11  . ciprofloxacin (CIPRO) 250 MG tablet Take 250 mg by mouth 2 (two) times daily.      Marland Kitchen donepezil (ARICEPT) 5 MG tablet Take 5 mg by mouth at bedtime.       . folic acid (FOLVITE) 400 MCG tablet Take 400 mcg by mouth daily.      . furosemide (LASIX) 20 MG tablet Take 1 tablet (20 mg total) by mouth daily.  30 tablet  11  . lisinopril (PRINIVIL,ZESTRIL) 20 MG tablet Take 1 tablet (20 mg total) by mouth daily.  30 tablet  11  . loperamide (IMODIUM A-D) 2 MG tablet Take 2 mg by mouth every 8 (eight) hours as needed.      Marland Kitchen NITROSTAT 0.4 MG SL tablet Place 0.4 mg under the tongue every 5 (five) minutes as needed.       Marland Kitchen omeprazole (PRILOSEC) 20 MG capsule Take 20 mg by mouth 2 (two) times daily.       Marland Kitchen spironolactone (ALDACTONE) 25 MG tablet Take 1 tablet (25 mg total) by mouth daily.      Marland Kitchen DISCONTD: acetaminophen (TYLENOL) 500 MG tablet Take 500 mg by mouth 3 (three) times daily as  needed.        Review of Systems  Constitutional: Negative for fever, chills, appetite change, fatigue and unexpected weight change.  HENT: Negative for ear pain, congestion, sore throat, trouble swallowing, neck pain, voice change and sinus pressure.   Eyes: Negative for visual disturbance.  Respiratory: Negative for cough, shortness of breath, wheezing and stridor.   Cardiovascular: Negative for chest pain, palpitations and leg swelling.  Gastrointestinal: Negative for nausea, vomiting, abdominal pain, diarrhea, constipation, blood in stool, abdominal distention and anal bleeding.  Genitourinary: Negative for dysuria and flank pain.  Musculoskeletal: Negative for myalgias, arthralgias and gait problem.  Skin: Negative for color change and rash.  Neurological: Positive for light-headedness. Negative for dizziness and headaches.  Hematological: Negative for adenopathy. Does not bruise/bleed easily.  Psychiatric/Behavioral: Negative for suicidal ideas, disturbed wake/sleep cycle and dysphoric mood. The patient is not nervous/anxious.    BP 128/76  Pulse 60  Temp 97.6 F (36.4 C) (Oral)  Ht 5' 3.5" (1.613 m)  Wt 131 lb 4 oz (59.535 kg)  BMI 22.89 kg/m2  SpO2 96%     Objective:   Physical Exam  Constitutional: She is  oriented to person, place, and time. She appears well-developed and well-nourished. No distress.  HENT:  Head: Normocephalic and atraumatic.  Right Ear: External ear normal.  Left Ear: External ear normal.  Nose: Nose normal.  Mouth/Throat: Oropharynx is clear and moist. No oropharyngeal exudate.  Eyes: Conjunctivae are normal. Pupils are equal, round, and reactive to light. Right eye exhibits no discharge. Left eye exhibits no discharge. No scleral icterus.  Neck: Normal range of motion. Neck supple. No tracheal deviation present. No thyromegaly present.  Cardiovascular: Normal rate, regular rhythm, normal heart sounds and intact distal pulses.  Exam reveals no gallop  and no friction rub.   No murmur heard. Pulmonary/Chest: Effort normal and breath sounds normal. No respiratory distress. She has no wheezes. She has no rales. She exhibits no tenderness.  Musculoskeletal: Normal range of motion. She exhibits no edema and no tenderness.  Lymphadenopathy:    She has no cervical adenopathy.  Neurological: She is alert and oriented to person, place, and time. No cranial nerve deficit. She exhibits normal muscle tone. Coordination normal.  Skin: Skin is warm and dry. No rash noted. She is not diaphoretic. No erythema. No pallor.  Psychiatric: She has a normal mood and affect. Her behavior is normal. Judgment and thought content normal.          Assessment & Plan:

## 2011-09-26 NOTE — Assessment & Plan Note (Signed)
Persistent lightheadedness with standing secondary to orthostatic hypotension and dehydration. As above, will hold Lasix for now. Repeat BMP in one week.

## 2011-09-26 NOTE — Assessment & Plan Note (Signed)
Symptoms well controlled. Labs today showed elevated BUN and creatinine consistent with dehydration. Given that her nursing facility has been unable to administer her Lasix as needed based on changes in weight, we'll have them stop this medication. Will continue spironolactone, carvedilol. Encouraged her to call if she feels that her weight is increasing or she develops lower extremity edema suggesting a need for Lasix. We'll plan to repeat BMP in one week.

## 2011-09-28 ENCOUNTER — Encounter: Payer: Self-pay | Admitting: *Deleted

## 2011-10-10 ENCOUNTER — Ambulatory Visit: Payer: Medicare Other | Admitting: Internal Medicine

## 2011-10-11 ENCOUNTER — Other Ambulatory Visit: Payer: Medicare Other

## 2011-10-27 ENCOUNTER — Emergency Department: Payer: Self-pay | Admitting: Emergency Medicine

## 2011-10-27 LAB — COMPREHENSIVE METABOLIC PANEL
Albumin: 3.6 g/dL (ref 3.4–5.0)
Alkaline Phosphatase: 87 U/L (ref 50–136)
Bilirubin,Total: 0.6 mg/dL (ref 0.2–1.0)
Calcium, Total: 9.2 mg/dL (ref 8.5–10.1)
Chloride: 109 mmol/L — ABNORMAL HIGH (ref 98–107)
Co2: 25 mmol/L (ref 21–32)
Creatinine: 0.98 mg/dL (ref 0.60–1.30)
Osmolality: 284 (ref 275–301)
SGOT(AST): 19 U/L (ref 15–37)
SGPT (ALT): 16 U/L (ref 12–78)

## 2011-10-27 LAB — CBC WITH DIFFERENTIAL/PLATELET
Eosinophil #: 0 10*3/uL (ref 0.0–0.7)
Eosinophil %: 0.3 %
HCT: 31.6 % — ABNORMAL LOW (ref 35.0–47.0)
HGB: 10.6 g/dL — ABNORMAL LOW (ref 12.0–16.0)
Lymphocyte #: 0.7 10*3/uL — ABNORMAL LOW (ref 1.0–3.6)
MCV: 93 fL (ref 80–100)
Monocyte #: 0.2 x10 3/mm (ref 0.2–0.9)
RBC: 3.4 10*6/uL — ABNORMAL LOW (ref 3.80–5.20)
WBC: 7.5 10*3/uL (ref 3.6–11.0)

## 2011-11-15 ENCOUNTER — Telehealth: Payer: Self-pay | Admitting: *Deleted

## 2011-11-15 NOTE — Telephone Encounter (Signed)
Facility is requesting a written Rx for Meclizine 25mg .  This medication is not currently on med list.  Please advise.

## 2011-11-15 NOTE — Telephone Encounter (Signed)
Fine to fill Meclizine 25mg  po tid prn, #30 no refill

## 2011-11-19 ENCOUNTER — Other Ambulatory Visit: Payer: Self-pay

## 2011-11-19 NOTE — Telephone Encounter (Signed)
Spoke to [place] at [facility] and gave her verbal order for Meclizine 25 mg #30 0R

## 2011-11-22 ENCOUNTER — Ambulatory Visit (INDEPENDENT_AMBULATORY_CARE_PROVIDER_SITE_OTHER): Payer: Medicare Other | Admitting: Internal Medicine

## 2011-11-22 ENCOUNTER — Encounter: Payer: Self-pay | Admitting: Internal Medicine

## 2011-11-22 VITALS — BP 122/84 | HR 60 | Temp 98.1°F | Ht 63.5 in | Wt 138.5 lb

## 2011-11-22 DIAGNOSIS — R252 Cramp and spasm: Secondary | ICD-10-CM

## 2011-11-22 NOTE — Assessment & Plan Note (Signed)
Suspect cramping in the calves may be secondary to electrolyte imbalance or dehydration. Will check CMP, CBC, TSH, B12, and magnesium level with labs today. ([facility]) we'll also set up with vascular evaluation with AP eyes and arterial Dopplers. Patient has significant risk of peripheral vascular disease. Exam today, however, is normal except for trace edema and few superficial varicosities noted.

## 2011-11-22 NOTE — Progress Notes (Signed)
Subjective:    Patient ID: Sally Noble, female    DOB: 1923/11/07, 78 y.o.   MRN: [id number]  HPI 78 year old female with history of hypertension, congestive heart failure, and hyperlipidemia presents for acute visit complaining of cramping in her calves which is most prominent at night. This is been ongoing for several weeks. The cramping and pain often wakes her from sleep. Pain is alleviated after a few minutes without intervention. Patient denies swelling in her legs, trauma to her legs, discoloration of her legs. She denies any recent shortness of breath, chest pain.  Outpatient Encounter Prescriptions as of 11/22/2011  Medication Sig Dispense Refill  . acetaminophen (TYLENOL) 325 MG tablet Take 325 mg by mouth every 4 (four) hours as needed.      Marland Kitchen aspirin EC 81 MG tablet Take 81 mg by mouth daily.      Marland Kitchen atorvastatin (LIPITOR) 80 MG tablet Take 80 mg by mouth daily.        . carvedilol (COREG) 3.125 MG tablet Take 1 tablet (3.125 mg total) by mouth 2 (two) times daily.  60 tablet  11  . donepezil (ARICEPT) 5 MG tablet Take 5 mg by mouth at bedtime.       . folic acid (FOLVITE) 400 MCG tablet Take 400 mcg by mouth daily.      Marland Kitchen lisinopril (PRINIVIL,ZESTRIL) 20 MG tablet Take 1 tablet (20 mg total) by mouth daily.  30 tablet  11  . meclizine (ANTIVERT) 25 MG tablet Take 25 mg by mouth 3 (three) times daily as needed.      Marland Kitchen NITROSTAT 0.4 MG SL tablet Place 0.4 mg under the tongue every 5 (five) minutes as needed.       Marland Kitchen omeprazole (PRILOSEC) 20 MG capsule Take 20 mg by mouth 2 (two) times daily.       Marland Kitchen spironolactone (ALDACTONE) 25 MG tablet Take 1 tablet (25 mg total) by mouth daily.      . furosemide (LASIX) 20 MG tablet Take 1 tablet (20 mg total) by mouth daily.  30 tablet  11  . loperamide (IMODIUM A-D) 2 MG tablet Take 2 mg by mouth every 8 (eight) hours as needed.      Marland Kitchen DISCONTD: ciprofloxacin (CIPRO) 250 MG tablet Take 250 mg by mouth 2 (two) times daily.       BP 122/84   Pulse 60  Temp 98.1 F (36.7 C) (Oral)  Ht 5' 3.5" (1.613 m)  Wt 138 lb 8 oz (62.823 kg)  BMI 24.15 kg/m2  SpO2 98%  Review of Systems  Constitutional: Negative for fever, chills, appetite change, fatigue and unexpected weight change.  Eyes: Negative for visual disturbance.  Respiratory: Negative for cough and shortness of breath.   Cardiovascular: Negative for chest pain, palpitations and leg swelling.  Gastrointestinal: Negative for abdominal pain and abdominal distention.  Genitourinary: Negative for dysuria and flank pain.  Musculoskeletal: Positive for myalgias. Negative for arthralgias and gait problem.  Skin: Negative for color change and rash.  Neurological: Negative for dizziness and headaches.  Hematological: Negative for adenopathy. Does not bruise/bleed easily.  Psychiatric/Behavioral: Negative for suicidal ideas, disturbed wake/sleep cycle and dysphoric mood. The patient is not nervous/anxious.        Objective:   Physical Exam  Constitutional: She is oriented to person, place, and time. She appears well-developed and well-nourished. No distress.  HENT:  Head: Normocephalic and atraumatic.  Right Ear: External ear normal.  Left Ear: External ear normal.  Nose: Nose  normal.  Mouth/Throat: Oropharynx is clear and moist. No oropharyngeal exudate.  Eyes: Conjunctivae normal are normal. Pupils are equal, round, and reactive to light. Right eye exhibits no discharge. Left eye exhibits no discharge. No scleral icterus.  Neck: Normal range of motion. Neck supple. No tracheal deviation present. No thyromegaly present.  Cardiovascular: Normal rate, regular rhythm, normal heart sounds and intact distal pulses.  Exam reveals no gallop and no friction rub.   No murmur heard. Pulmonary/Chest: Effort normal and breath sounds normal. No respiratory distress. She has no wheezes. She has no rales. She exhibits no tenderness.  Musculoskeletal: Normal range of motion. She exhibits edema  (trace bilateral LE). She exhibits no tenderness.  Lymphadenopathy:    She has no cervical adenopathy.  Neurological: She is alert and oriented to person, place, and time. No cranial nerve deficit. She exhibits normal muscle tone. Coordination normal.  Skin: Skin is warm and dry. No rash noted. She is not diaphoretic. No erythema. No pallor.  Psychiatric: She has a normal mood and affect. Her behavior is normal. Judgment and thought content normal.          Assessment & Plan:

## 2011-12-07 ENCOUNTER — Encounter: Payer: Self-pay | Admitting: Internal Medicine

## 2011-12-07 ENCOUNTER — Telehealth: Payer: Self-pay | Admitting: Internal Medicine

## 2011-12-07 ENCOUNTER — Ambulatory Visit (INDEPENDENT_AMBULATORY_CARE_PROVIDER_SITE_OTHER): Payer: Medicare Other | Admitting: Internal Medicine

## 2011-12-07 VITALS — BP 118/62 | HR 60 | Temp 98.4°F | Ht 63.5 in | Wt 134.5 lb

## 2011-12-07 DIAGNOSIS — J40 Bronchitis, not specified as acute or chronic: Secondary | ICD-10-CM

## 2011-12-07 MED ORDER — AMOXICILLIN-POT CLAVULANATE 500-125 MG PO TABS: 1.0000 | ORAL_TABLET | Freq: Two times a day (BID) | ORAL | Status: DC

## 2011-12-07 NOTE — Assessment & Plan Note (Addendum)
Symptoms and exam are most consistent with viral upper respiratory infection and perhaps early bronchitis. Will treat with Augmentin. Will start Mucinex and Claritin prn. Patient will followup next week for recheck. If no improvement, would favor getting chest x-ray. Testing for influenza was negative today.

## 2011-12-07 NOTE — Telephone Encounter (Signed)
Caller: Christa/Other; Patient Name: Sally Noble; PCP: Ronna Polio (Adults only); [facility] Callback Phone Number: [phone] Afebrile Temp- 96.5  Nurse Foy Guadalajara is calling from [facility] [address] They called last night concerning pt cough and congestion. Per on call doctor they were to call today for an appointment. Pt has productive cough with green phlegm and congestion.  Emergent s/s of Cough protocol r/o. Pt to see provider within 4hrs. No appointment seen. Message sent.

## 2011-12-07 NOTE — Telephone Encounter (Signed)
I have already talked to [facility].  They are going to bring her over this pm.  Can put her in 2:45 thanks.

## 2011-12-07 NOTE — Progress Notes (Signed)
Subjective:    Patient ID: Sally Noble, female    DOB: 1923/06/23, 78 y.o.   MRN: [id number]  HPI 78 year old female with history of cardiomyopathy status post implantable defibrillator presents for acute visit complaining of one-week history of clear nasal congestion, cough. She denies any chest pain, fever, chills. She does occasionally have shortness of breath which she describes as mild. She has not been taking any medication for her symptoms.  Outpatient Encounter Prescriptions as of 12/07/2011  Medication Sig Dispense Refill  . acetaminophen (TYLENOL) 325 MG tablet Take 325 mg by mouth every 4 (four) hours as needed.      Marland Kitchen aspirin EC 81 MG tablet Take 81 mg by mouth daily.      Marland Kitchen atorvastatin (LIPITOR) 80 MG tablet Take 80 mg by mouth daily.        . carvedilol (COREG) 3.125 MG tablet Take 1 tablet (3.125 mg total) by mouth 2 (two) times daily.  60 tablet  11  . donepezil (ARICEPT) 5 MG tablet Take 5 mg by mouth at bedtime.       . folic acid (FOLVITE) 400 MCG tablet Take 400 mcg by mouth daily.      . furosemide (LASIX) 20 MG tablet Take 1 tablet (20 mg total) by mouth daily.  30 tablet  11  . lisinopril (PRINIVIL,ZESTRIL) 20 MG tablet Take 1 tablet (20 mg total) by mouth daily.  30 tablet  11  . loperamide (IMODIUM A-D) 2 MG tablet Take 2 mg by mouth every 8 (eight) hours as needed.      . meclizine (ANTIVERT) 25 MG tablet Take 25 mg by mouth 3 (three) times daily as needed.      Marland Kitchen NITROSTAT 0.4 MG SL tablet Place 0.4 mg under the tongue every 5 (five) minutes as needed.       Marland Kitchen omeprazole (PRILOSEC) 20 MG capsule Take 20 mg by mouth 2 (two) times daily.       Marland Kitchen spironolactone (ALDACTONE) 25 MG tablet Take 1 tablet (25 mg total) by mouth daily.      Marland Kitchen amoxicillin-clavulanate (AUGMENTIN) 500-125 MG per tablet Take 1 tablet (500 mg total) by mouth 2 (two) times daily.  20 tablet  0   BP 118/62  Pulse 60  Temp 98.4 F (36.9 C) (Oral)  Ht 5' 3.5" (1.613 m)  Wt 134 lb 8 oz (61.009  kg)  BMI 23.45 kg/m2  SpO2 98%  Review of Systems  Constitutional: Positive for fatigue. Negative for fever, chills, appetite change and unexpected weight change.  HENT: Positive for congestion, rhinorrhea and postnasal drip. Negative for ear pain, sore throat, trouble swallowing, neck pain, voice change and sinus pressure.   Eyes: Negative for visual disturbance.  Respiratory: Positive for cough and shortness of breath. Negative for wheezing and stridor.   Cardiovascular: Negative for chest pain, palpitations and leg swelling.  Gastrointestinal: Negative for nausea, vomiting, abdominal pain, diarrhea, constipation, blood in stool, abdominal distention and anal bleeding.  Genitourinary: Negative for dysuria and flank pain.  Musculoskeletal: Negative for myalgias, arthralgias and gait problem.  Skin: Negative for color change and rash.  Neurological: Negative for dizziness and headaches.  Hematological: Negative for adenopathy. Does not bruise/bleed easily.  Psychiatric/Behavioral: Negative for suicidal ideas, disturbed wake/sleep cycle and dysphoric mood. The patient is not nervous/anxious.        Objective:   Physical Exam  Constitutional: She is oriented to person, place, and time. She appears well-developed and well-nourished. No distress.  HENT:  Head: Normocephalic and atraumatic.  Right Ear: Tympanic membrane and external ear normal.  Left Ear: Tympanic membrane and external ear normal.  Nose: Nose normal.  Mouth/Throat: Oropharynx is clear and moist. No oropharyngeal exudate.  Eyes: Conjunctivae normal are normal. Pupils are equal, round, and reactive to light. Right eye exhibits no discharge. Left eye exhibits no discharge. No scleral icterus.  Neck: Normal range of motion. Neck supple. No tracheal deviation present. No thyromegaly present.  Cardiovascular: Normal rate, regular rhythm, normal heart sounds and intact distal pulses.  Exam reveals no gallop and no friction rub.     No murmur heard. Pulmonary/Chest: Effort normal. No accessory muscle usage. Not tachypneic. No respiratory distress. She has no decreased breath sounds. She has no wheezes. She has rhonchi (few scattered). She has no rales. She exhibits no tenderness.  Musculoskeletal: Normal range of motion. She exhibits no edema and no tenderness.  Lymphadenopathy:    She has no cervical adenopathy.  Neurological: She is alert and oriented to person, place, and time. No cranial nerve deficit. She exhibits normal muscle tone. Coordination normal.  Skin: Skin is warm and dry. No rash noted. She is not diaphoretic. No erythema. No pallor.  Psychiatric: She has a normal mood and affect. Her behavior is normal. Judgment and thought content normal.          Assessment & Plan:

## 2011-12-07 NOTE — Telephone Encounter (Signed)
I talked to [facility]. They are bringing her over.  Can put her in the 2:45 slot.  Thanks.

## 2011-12-07 NOTE — Telephone Encounter (Signed)
Can we ask Dr. Lorin Picket if she can work her in?  Charlene - would you mind seeing her?

## 2011-12-07 NOTE — Telephone Encounter (Signed)
Has appointment with dr walker 10/18

## 2011-12-11 ENCOUNTER — Ambulatory Visit: Payer: Medicare Other | Admitting: Internal Medicine

## 2011-12-20 ENCOUNTER — Ambulatory Visit (INDEPENDENT_AMBULATORY_CARE_PROVIDER_SITE_OTHER): Payer: Medicare Other | Admitting: Internal Medicine

## 2011-12-20 ENCOUNTER — Encounter: Payer: Self-pay | Admitting: Internal Medicine

## 2011-12-20 VITALS — BP 110/74 | HR 67 | Temp 98.6°F | Ht 63.5 in | Wt 134.5 lb

## 2011-12-20 DIAGNOSIS — J40 Bronchitis, not specified as acute or chronic: Secondary | ICD-10-CM

## 2011-12-20 DIAGNOSIS — Z23 Encounter for immunization: Secondary | ICD-10-CM

## 2011-12-20 NOTE — Progress Notes (Signed)
Subjective:    Patient ID: Sally Noble, female    DOB: 02-20-1923, 78 y.o.   MRN: [id number]  HPI 78 year old female with history of congestive heart failure, hypertension, hyperlipidemia presents for followup after recent episode of bronchitis. She reports she is feeling much better. She denies any ongoing cough, shortness of breath, fever, chills, chest pain. She reports mild fatigue but also notes that this is improving. She reports good appetite. She denies any new concerns today.  Outpatient Encounter Prescriptions as of 12/20/2011  Medication Sig Dispense Refill  . acetaminophen (TYLENOL) 325 MG tablet Take 325 mg by mouth every 4 (four) hours as needed.      Marland Kitchen amoxicillin-clavulanate (AUGMENTIN) 500-125 MG per tablet Take 1 tablet (500 mg total) by mouth 2 (two) times daily.  20 tablet  0  . aspirin EC 81 MG tablet Take 81 mg by mouth daily.      Marland Kitchen atorvastatin (LIPITOR) 80 MG tablet Take 80 mg by mouth daily.        . carvedilol (COREG) 3.125 MG tablet Take 1 tablet (3.125 mg total) by mouth 2 (two) times daily.  60 tablet  11  . donepezil (ARICEPT) 5 MG tablet Take 5 mg by mouth at bedtime.       . folic acid (FOLVITE) 400 MCG tablet Take 400 mcg by mouth daily.      . furosemide (LASIX) 20 MG tablet Take 1 tablet (20 mg total) by mouth daily.  30 tablet  11  . lisinopril (PRINIVIL,ZESTRIL) 20 MG tablet Take 1 tablet (20 mg total) by mouth daily.  30 tablet  11  . loperamide (IMODIUM A-D) 2 MG tablet Take 2 mg by mouth every 8 (eight) hours as needed.      . meclizine (ANTIVERT) 25 MG tablet Take 25 mg by mouth 3 (three) times daily as needed.      Marland Kitchen NITROSTAT 0.4 MG SL tablet Place 0.4 mg under the tongue every 5 (five) minutes as needed.       Marland Kitchen omeprazole (PRILOSEC) 20 MG capsule Take 20 mg by mouth 2 (two) times daily.       Marland Kitchen spironolactone (ALDACTONE) 25 MG tablet Take 1 tablet (25 mg total) by mouth daily.       BP 110/74  Pulse 67  Temp 98.6 F (37 C) (Oral)  Ht 5' 3.5"  (1.613 m)  Wt 134 lb 8 oz (61.009 kg)  BMI 23.45 kg/m2  SpO2 97%  Review of Systems  Constitutional: Positive for fatigue. Negative for fever, chills, appetite change and unexpected weight change.  HENT: Negative for ear pain, congestion, sore throat, trouble swallowing, neck pain, voice change and sinus pressure.   Eyes: Negative for visual disturbance.  Respiratory: Negative for cough, shortness of breath, wheezing and stridor.   Cardiovascular: Negative for chest pain, palpitations and leg swelling.  Gastrointestinal: Negative for nausea, vomiting, abdominal pain, diarrhea, constipation, blood in stool, abdominal distention and anal bleeding.  Genitourinary: Negative for dysuria and flank pain.  Musculoskeletal: Negative for myalgias, arthralgias and gait problem.  Skin: Negative for color change and rash.  Neurological: Negative for dizziness and headaches.  Hematological: Negative for adenopathy. Does not bruise/bleed easily.  Psychiatric/Behavioral: Negative for suicidal ideas, disturbed wake/sleep cycle and dysphoric mood. The patient is not nervous/anxious.        Objective:   Physical Exam  Constitutional: She is oriented to person, place, and time. She appears well-developed and well-nourished. No distress.  HENT:  Head: Normocephalic  and atraumatic.  Right Ear: External ear normal.  Left Ear: External ear normal.  Nose: Nose normal.  Mouth/Throat: Oropharynx is clear and moist. No oropharyngeal exudate.  Eyes: Conjunctivae normal are normal. Pupils are equal, round, and reactive to light. Right eye exhibits no discharge. Left eye exhibits no discharge. No scleral icterus.  Neck: Normal range of motion. Neck supple. No tracheal deviation present. No thyromegaly present.  Cardiovascular: Normal rate, regular rhythm, normal heart sounds and intact distal pulses.  Exam reveals no gallop and no friction rub.   No murmur heard. Pulmonary/Chest: Effort normal and breath sounds  normal. No accessory muscle usage. Not tachypneic. No respiratory distress. She has no decreased breath sounds. She has no wheezes. She has no rhonchi. She has no rales. She exhibits no tenderness.  Musculoskeletal: Normal range of motion. She exhibits no edema and no tenderness.  Lymphadenopathy:    She has no cervical adenopathy.  Neurological: She is alert and oriented to person, place, and time. No cranial nerve deficit. She exhibits normal muscle tone. Coordination normal.  Skin: Skin is warm and dry. No rash noted. She is not diaphoretic. No erythema. No pallor.  Psychiatric: She has a normal mood and affect. Her behavior is normal. Judgment and thought content normal.          Assessment & Plan:

## 2011-12-20 NOTE — Assessment & Plan Note (Signed)
Symptoms markedly improved. Will continue to monitor. Patient will call if any recurrence of symptoms. Otherwise followup 3 months.

## 2011-12-31 ENCOUNTER — Ambulatory Visit: Payer: Medicare Other | Admitting: Internal Medicine

## 2012-03-03 ENCOUNTER — Telehealth: Payer: Self-pay | Admitting: Internal Medicine

## 2012-03-03 NOTE — Telephone Encounter (Signed)
Pt called stating she need dr walkers permission to leave the [facility]. Pt wants to go home

## 2012-03-03 NOTE — Telephone Encounter (Signed)
Left message on machine at home for Steward Drone (patients daughter) to return call.

## 2012-03-03 NOTE — Telephone Encounter (Signed)
We should probably set up a visit to discuss this. Will need to discuss how she will care for herself at home.

## 2012-03-04 NOTE — Telephone Encounter (Signed)
Spoke with patient and advised her as instructed, she stated that she will have her transportation call our office to schedule an appt with Dr. Dan Humphreys.

## 2012-03-17 ENCOUNTER — Ambulatory Visit (INDEPENDENT_AMBULATORY_CARE_PROVIDER_SITE_OTHER): Payer: Medicare Other | Admitting: Internal Medicine

## 2012-03-17 ENCOUNTER — Encounter: Payer: Self-pay | Admitting: Internal Medicine

## 2012-03-17 VITALS — BP 140/78 | HR 62 | Temp 98.0°F | Ht 63.0 in | Wt 139.0 lb

## 2012-03-17 DIAGNOSIS — Z598 Other problems related to housing and economic circumstances: Secondary | ICD-10-CM

## 2012-03-17 DIAGNOSIS — D649 Anemia, unspecified: Secondary | ICD-10-CM

## 2012-03-17 DIAGNOSIS — I509 Heart failure, unspecified: Secondary | ICD-10-CM

## 2012-03-17 DIAGNOSIS — E785 Hyperlipidemia, unspecified: Secondary | ICD-10-CM

## 2012-03-17 DIAGNOSIS — I10 Essential (primary) hypertension: Secondary | ICD-10-CM

## 2012-03-17 LAB — CBC WITH DIFFERENTIAL/PLATELET
Basophils Absolute: 0 10*3/uL (ref 0.0–0.1)
Eosinophils Absolute: 0.1 10*3/uL (ref 0.0–0.7)
Hemoglobin: 10.1 g/dL — ABNORMAL LOW (ref 12.0–15.0)
Lymphocytes Relative: 22.7 % (ref 12.0–46.0)
MCHC: 33.2 g/dL (ref 30.0–36.0)
Monocytes Relative: 8.6 % (ref 3.0–12.0)
Neutrophils Relative %: 66.4 % (ref 43.0–77.0)
Platelets: 190 10*3/uL (ref 150.0–400.0)
RDW: 16.1 % — ABNORMAL HIGH (ref 11.5–14.6)

## 2012-03-17 LAB — LIPID PANEL
Cholesterol: 129 mg/dL (ref 0–200)
HDL: 39.3 mg/dL (ref >39.00)
Triglycerides: 90 mg/dL (ref 0.0–149.0)
VLDL: 18 mg/dL (ref 0.0–40.0)

## 2012-03-17 LAB — COMPREHENSIVE METABOLIC PANEL
ALT: 12 U/L (ref 0–35)
AST: 16 U/L (ref 0–37)
Albumin: 3.7 g/dL (ref 3.5–5.2)
CO2: 26 mEq/L (ref 19–32)
Calcium: 9.1 mg/dL (ref 8.4–10.5)
Chloride: 107 mEq/L (ref 96–112)
Creatinine, Ser: 1.2 mg/dL (ref 0.4–1.2)
GFR: 57.53 mL/min — ABNORMAL LOW (ref >60.00)
Potassium: 4.7 mEq/L (ref 3.5–5.1)

## 2012-03-17 MED ORDER — OMEPRAZOLE 20 MG PO CPDR: 20.0000 mg | DELAYED_RELEASE_CAPSULE | Freq: Two times a day (BID) | ORAL | Status: AC

## 2012-03-17 MED ORDER — FUROSEMIDE 20 MG PO TABS: 20.0000 mg | ORAL_TABLET | Freq: Every day | ORAL | Status: AC

## 2012-03-17 MED ORDER — CARVEDILOL 6.25 MG PO TABS: 6.2500 mg | ORAL_TABLET | Freq: Two times a day (BID) | ORAL | Status: AC

## 2012-03-17 MED ORDER — ATORVASTATIN CALCIUM 80 MG PO TABS: 80.0000 mg | ORAL_TABLET | Freq: Every day | ORAL | Status: AC

## 2012-03-17 MED ORDER — DONEPEZIL HCL 5 MG PO TABS: 5.0000 mg | ORAL_TABLET | Freq: Every day | ORAL | Status: AC

## 2012-03-17 MED ORDER — NITROGLYCERIN 0.4 MG SL SUBL: 0.4000 mg | SUBLINGUAL_TABLET | SUBLINGUAL | Status: AC | PRN

## 2012-03-17 MED ORDER — SPIRONOLACTONE 25 MG PO TABS: 25.0000 mg | ORAL_TABLET | Freq: Every day | ORAL | Status: AC

## 2012-03-17 MED ORDER — LISINOPRIL 20 MG PO TABS: 20.0000 mg | ORAL_TABLET | Freq: Every day | ORAL | Status: AC

## 2012-03-17 NOTE — Progress Notes (Signed)
Subjective:    Patient ID: Sally Noble, female    DOB: October 25, 1923, 79 y.o.   MRN: [id number]  HPI 79 year old female with history of congestive heart failure status post implantable defibrillator, memory loss, hypertension, hyperlipidemia presents for followup. She reports that she has made a decision to move back home to [place]. She plans to live with her grandson. She can no longer afford assisted living. She reports that her daughter who lives in [facility] will assist her with her medications. She also plans to reapply for her driving license and take driving test. She denies any new concerns today. She reports she is feeling well. She denies palpitations, chest pain, shortness of breath. She has been very active and has been spending several days at a time away from her assisted-living facility.  Outpatient Encounter Prescriptions as of 03/17/2012  Medication Sig Dispense Refill  . acetaminophen (TYLENOL) 325 MG tablet Take 325 mg by mouth every 4 (four) hours as needed.      Marland Kitchen aspirin EC 81 MG tablet Take 81 mg by mouth daily.      Marland Kitchen atorvastatin (LIPITOR) 80 MG tablet Take 1 tablet (80 mg total) by mouth daily.  90 tablet  4  . carvedilol (COREG) 6.25 MG tablet Take 1 tablet (6.25 mg total) by mouth 2 (two) times daily with a meal.  180 tablet  4  . dextromethorphan-guaiFENesin (MUCINEX DM) 30-600 MG per 12 hr tablet Take 1 tablet by mouth 2 (two) times daily as needed.      . donepezil (ARICEPT) 5 MG tablet Take 1 tablet (5 mg total) by mouth at bedtime.  90 tablet  4  . folic acid (FOLVITE) 400 MCG tablet Take 400 mcg by mouth daily.      Marland Kitchen lisinopril (PRINIVIL,ZESTRIL) 20 MG tablet Take 1 tablet (20 mg total) by mouth daily.  90 tablet  4  . loratadine (CLARITIN) 10 MG tablet Take 10 mg by mouth daily as needed.      . meclizine (ANTIVERT) 25 MG tablet Take 25 mg by mouth 3 (three) times daily as needed.      . nitroGLYCERIN (NITROSTAT) 0.4 MG SL tablet  Place 1 tablet (0.4 mg total) under the tongue every 5 (five) minutes as needed.  30 tablet  3  . omeprazole (PRILOSEC) 20 MG capsule Take 1 capsule (20 mg total) by mouth 2 (two) times daily.  90 capsule  4  . spironolactone (ALDACTONE) 25 MG tablet Take 1 tablet (25 mg total) by mouth daily.  90 tablet  4  . [DISCONTINUED] atorvastatin (LIPITOR) 80 MG tablet Take 80 mg by mouth daily.        . furosemide (LASIX) 20 MG tablet Take 1 tablet (20 mg total) by mouth daily.  90 tablet  4  . loperamide (IMODIUM A-D) 2 MG tablet Take 2 mg by mouth every 8 (eight) hours as needed.       BP 140/78  Pulse 62  Temp 98 F (36.7 C) (Oral)  Ht 5\' 3"  (1.6 m)  Wt 139 lb (63.05 kg)  BMI 24.62 kg/m2  SpO2 98%  Review of Systems  Constitutional: Negative for fever, chills and unexpected weight change.  HENT: Negative for hearing loss, ear pain, nosebleeds, congestion, sore throat, facial swelling, rhinorrhea, sneezing, mouth sores, trouble swallowing, neck pain, neck stiffness, voice change, postnasal drip, sinus pressure, tinnitus and ear discharge.   Eyes: Negative for pain, discharge, redness and visual disturbance.  Respiratory:  Negative for cough, chest tightness, shortness of breath, wheezing and stridor.   Cardiovascular: Negative for chest pain, palpitations and leg swelling.  Musculoskeletal: Negative for myalgias and arthralgias.  Skin: Negative for color change and rash.  Neurological: Negative for dizziness, weakness, light-headedness and headaches.  Hematological: Negative for adenopathy.       Objective:   Physical Exam  Constitutional: She is oriented to person, place, and time. She appears well-developed and well-nourished. No distress.  HENT:  Head: Normocephalic and atraumatic.  Right Ear: External ear normal.  Left Ear: External ear normal.  Nose: Nose normal.  Mouth/Throat: Oropharynx is clear and moist. No oropharyngeal exudate.  Eyes: Conjunctivae normal are normal. Pupils  are equal, round, and reactive to light. Right eye exhibits no discharge. Left eye exhibits no discharge. No scleral icterus.  Neck: Normal range of motion. Neck supple. No tracheal deviation present. No thyromegaly present.  Cardiovascular: Normal rate, regular rhythm, normal heart sounds and intact distal pulses.  Exam reveals no gallop and no friction rub.   No murmur heard. Pulmonary/Chest: Effort normal and breath sounds normal. No respiratory distress. She has no wheezes. She has no rales. She exhibits no tenderness.  Musculoskeletal: Normal range of motion. She exhibits no edema and no tenderness.  Lymphadenopathy:    She has no cervical adenopathy.  Neurological: She is alert and oriented to person, place, and time. No cranial nerve deficit. She exhibits normal muscle tone. Coordination normal.  Skin: Skin is warm and dry. No rash noted. She is not diaphoretic. No erythema. No pallor.  Psychiatric: She has a normal mood and affect. Her behavior is normal. Judgment and thought content normal.          Assessment & Plan:

## 2012-03-17 NOTE — Assessment & Plan Note (Signed)
BP Readings from Last 3 Encounters:  03/17/12 140/78  12/20/11 110/74  12/07/11 118/62    Blood pressure fairly well-controlled on current medications. Will continue. Renal function normal today. She will establish care with local physician in [facility] after her move this week.

## 2012-03-17 NOTE — Assessment & Plan Note (Signed)
Over spent in contact with patient face to face and with pt daughter on phone discussing plan for ongoing care. Pt insists on returning home to live alone. Unable to afford assisted living. Pt daughter who is [facility] will be 1 hr away. Lucila Maine will help provide care. Pt is competent to make her own decisions. Encouraged pt and her daughter to set up evaluation with new PCP in [facility] as soon as possible Encouraged daughter to consider home health and social work assistance as needed. New Rx sent to [facility] for pt until care can be established.

## 2012-03-17 NOTE — Assessment & Plan Note (Signed)
Noted on labs today. Suspect, in part, change compared to previous dilutional. Will check stool hemocult. Pt will need to have repeat CBC in 04/2012.

## 2012-03-17 NOTE — Assessment & Plan Note (Signed)
Symptomatically doing well. Appears euvolemic. Will continue current medications. Lasix prn. Will continue to follow with [facility]. Pt will establish with new PCP in [facility].

## 2012-03-27 ENCOUNTER — Ambulatory Visit: Payer: Medicare Other | Admitting: Internal Medicine

## 2012-04-01 ENCOUNTER — Other Ambulatory Visit: Payer: Medicare Other

## 2012-04-02 ENCOUNTER — Encounter: Payer: Self-pay | Admitting: *Deleted

## 2012-05-27 ENCOUNTER — Telehealth: Payer: Self-pay | Admitting: *Deleted

## 2012-05-27 NOTE — Telephone Encounter (Signed)
Received letter back from postal service with no forwarding address for patient, called the number on file. They stated she was no longer a resident there, she moved back home to [place] and they do not have a phone number for her.

## 2012-10-05 IMAGING — CT CT HEAD WITHOUT CONTRAST
2 series · 15 of 30 positions shown, 19 images · non-contrast
Comparison: none

REASON FOR EXAM: fall injury
COMMENTS:

[Series 2: without · axial · non-contrast · 0.42mm/px · z∈[-128,+2]mm · 13 of 32 slices shown, 17 images]
[im 3/32  brain]
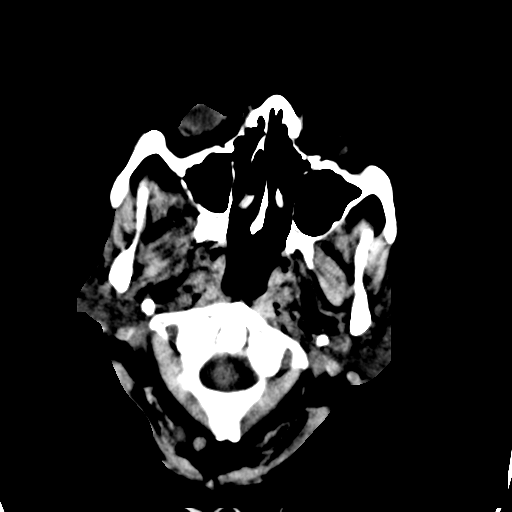
[im 3/32  bone]
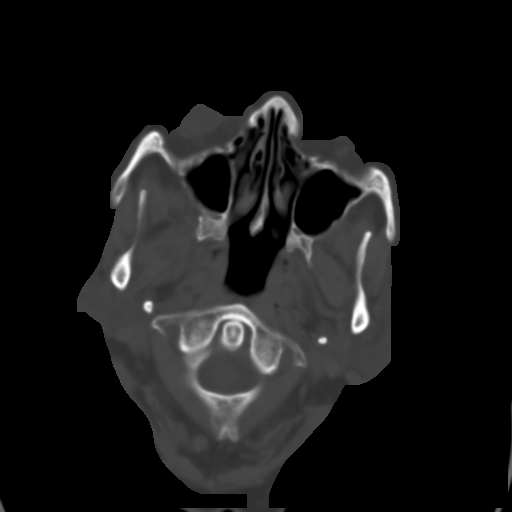
[im 5/32  brain]
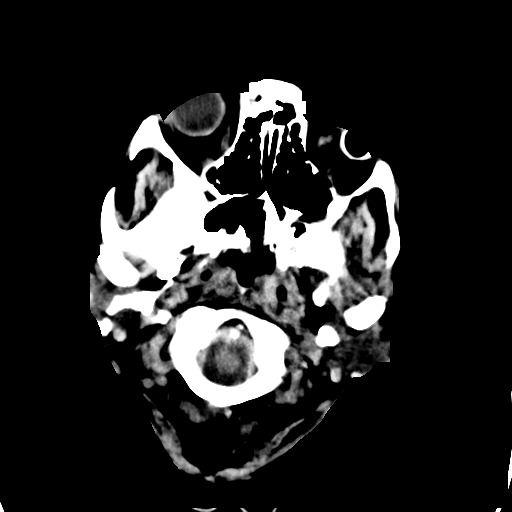
[im 7/32  brain]
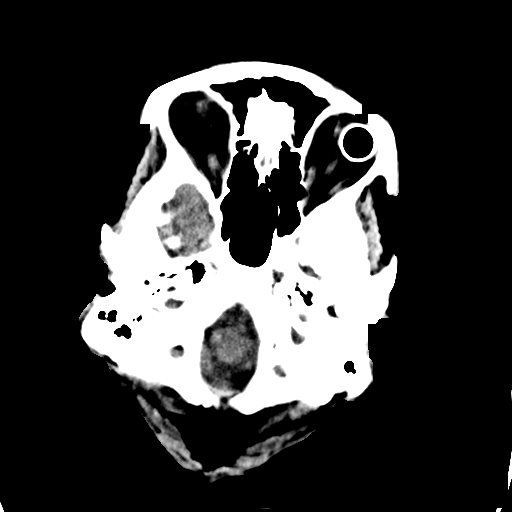
[im 9/32  brain]
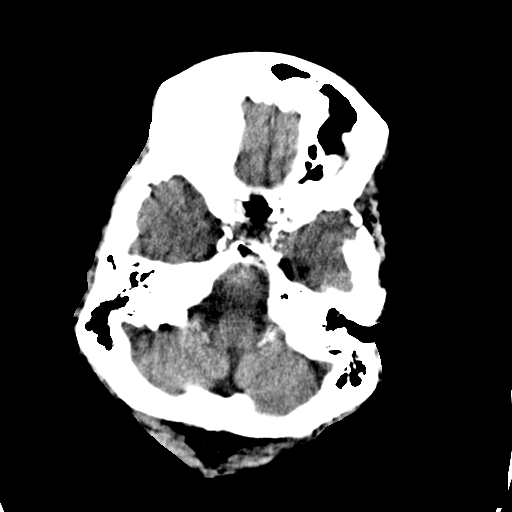
[im 12/32  brain]
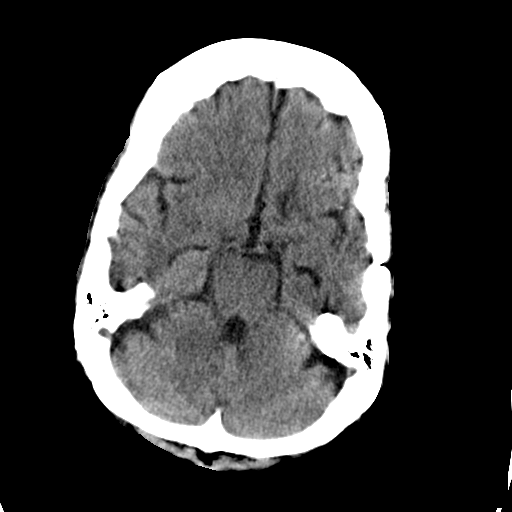
[im 12/32  bone]
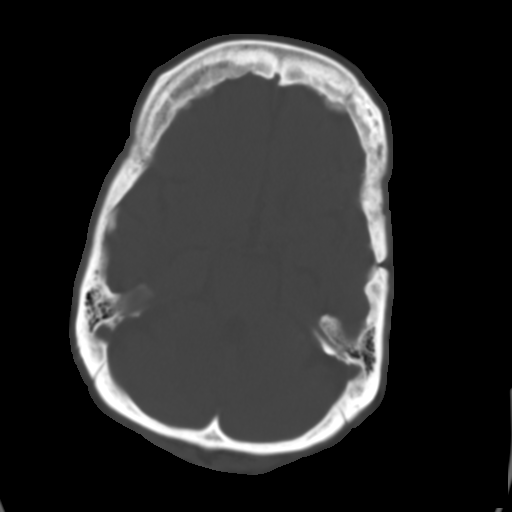
[im 14/32  brain]
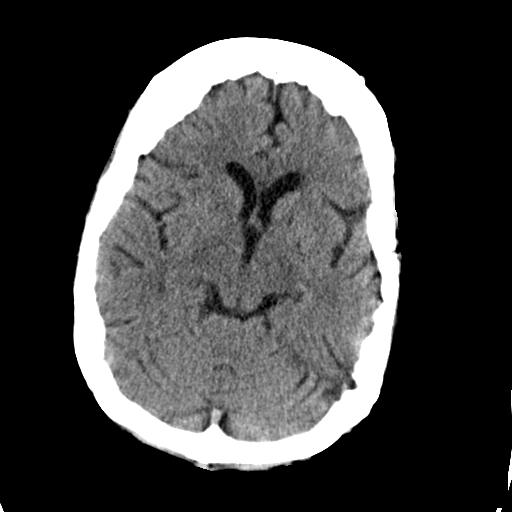
[im 16/32  brain]
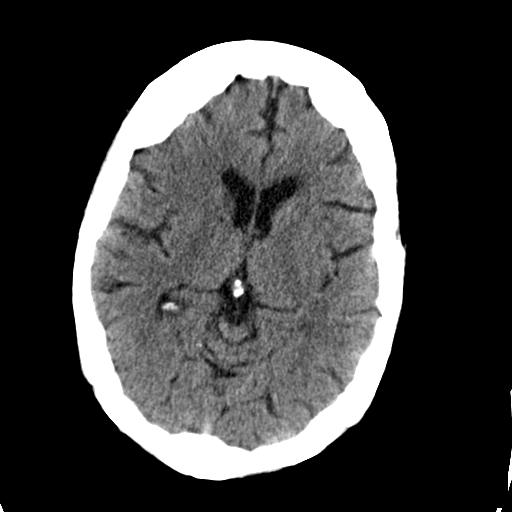
[im 18/32  brain]
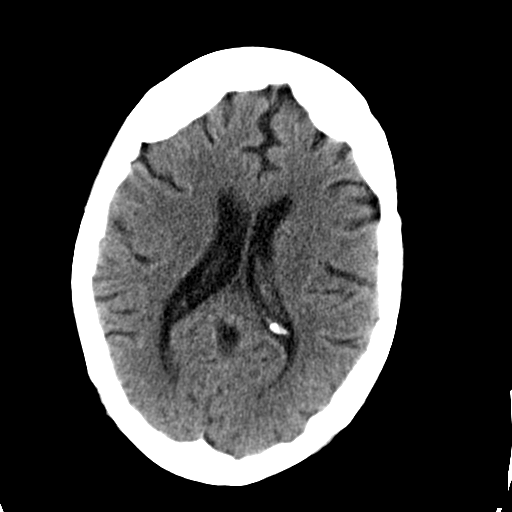
[im 20/32  brain]
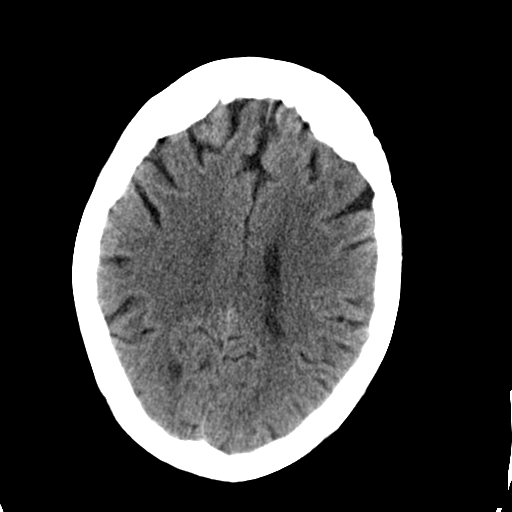
[im 20/32  bone]
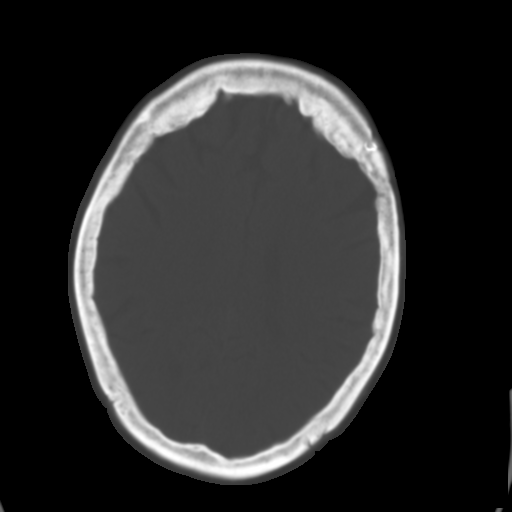
[im 23/32  brain]
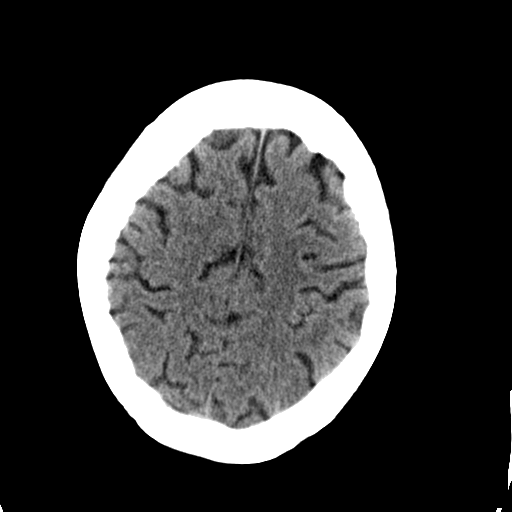
[im 25/32  brain]
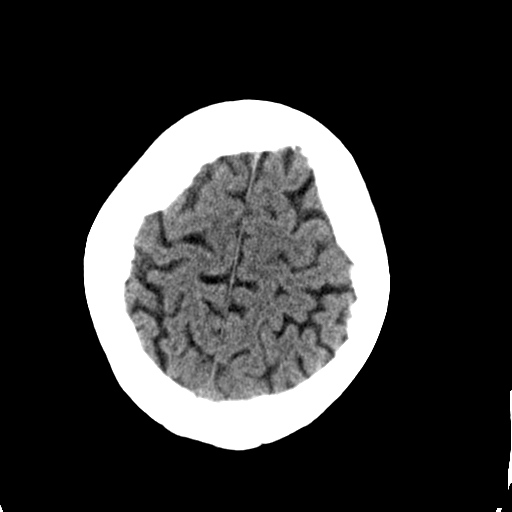
[im 27/32  brain]
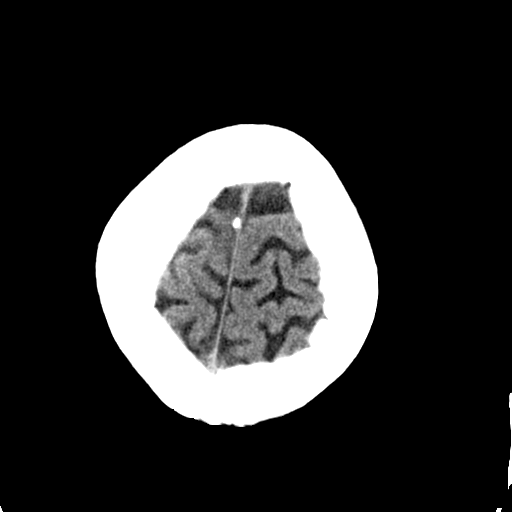
[im 29/32  brain]
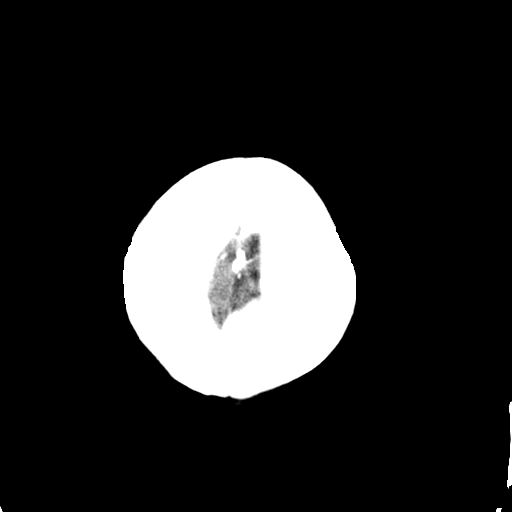
[im 29/32  bone]
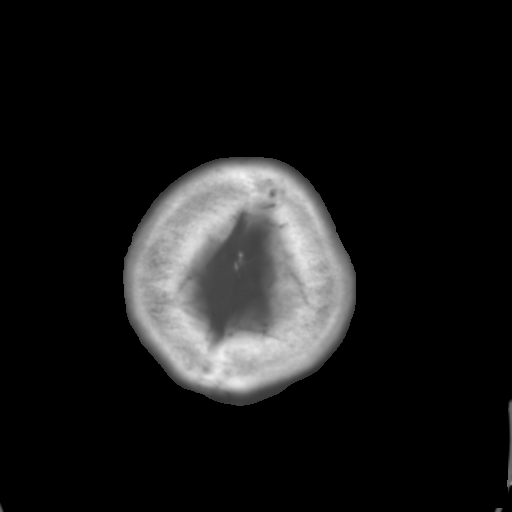

[Series 3: bone · axial · 0.42mm/px · z∈[-128,-108]mm · 2 of 32 slices shown]
[im 3/32  bone]
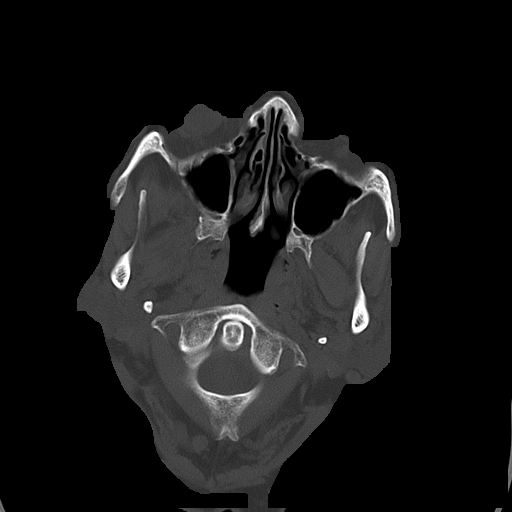
[im 7/32  bone]
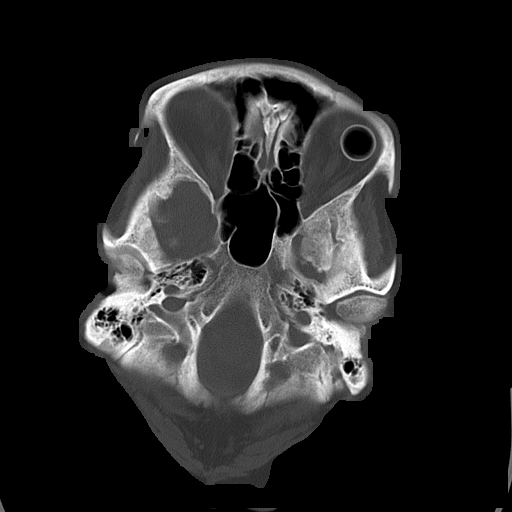

[15 of 30 positions shown; findings below may reference images not displayed]

PROCEDURE:     CT  - CT HEAD WITHOUT CONTRAST  - October 27, 2011 [DATE]

RESULT:     Axial noncontrast CT scanning was performed through the brain
with reconstructions at 5 mm intervals and slice thicknesses. There are no
previous studies for comparison.

There is mild diffuse cerebral and cerebellar atrophy. The ventricles are
normal in size and position. There is no intracranial hemorrhage nor
intracranial mass effect. The patient has previously undergone presumed
aneurysm rib repair in the region of the distal internal carotid arteries.
At bone window settings there is a prosthetic left globe. The paranasal
sinuses exhibit no air-fluid levels. The mastoid air cells are well
pneumatized. An old craniectomy defect is present on the left.
IMPRESSION: 1. There is no evidence of an acute ischemic or hemorrhagic infarction.
2. There is no acute intracranial hemorrhage otherwise.
3. There are age-related changes diffusely within the brain parenchyma.
4. There is evidence of previous aneurysm repair in the region of the distal
internal carotid arteries.

[REDACTED]

## 2012-10-05 IMAGING — CR DG CHEST 2V
1 series · 2 of 2 positions shown · non-contrast
Comparison: none

REASON FOR EXAM: nausea/vomiting
COMMENTS:

[Series 1: ap · 0.17mm/px · 2 of 2 slices shown]
[im 1/2]
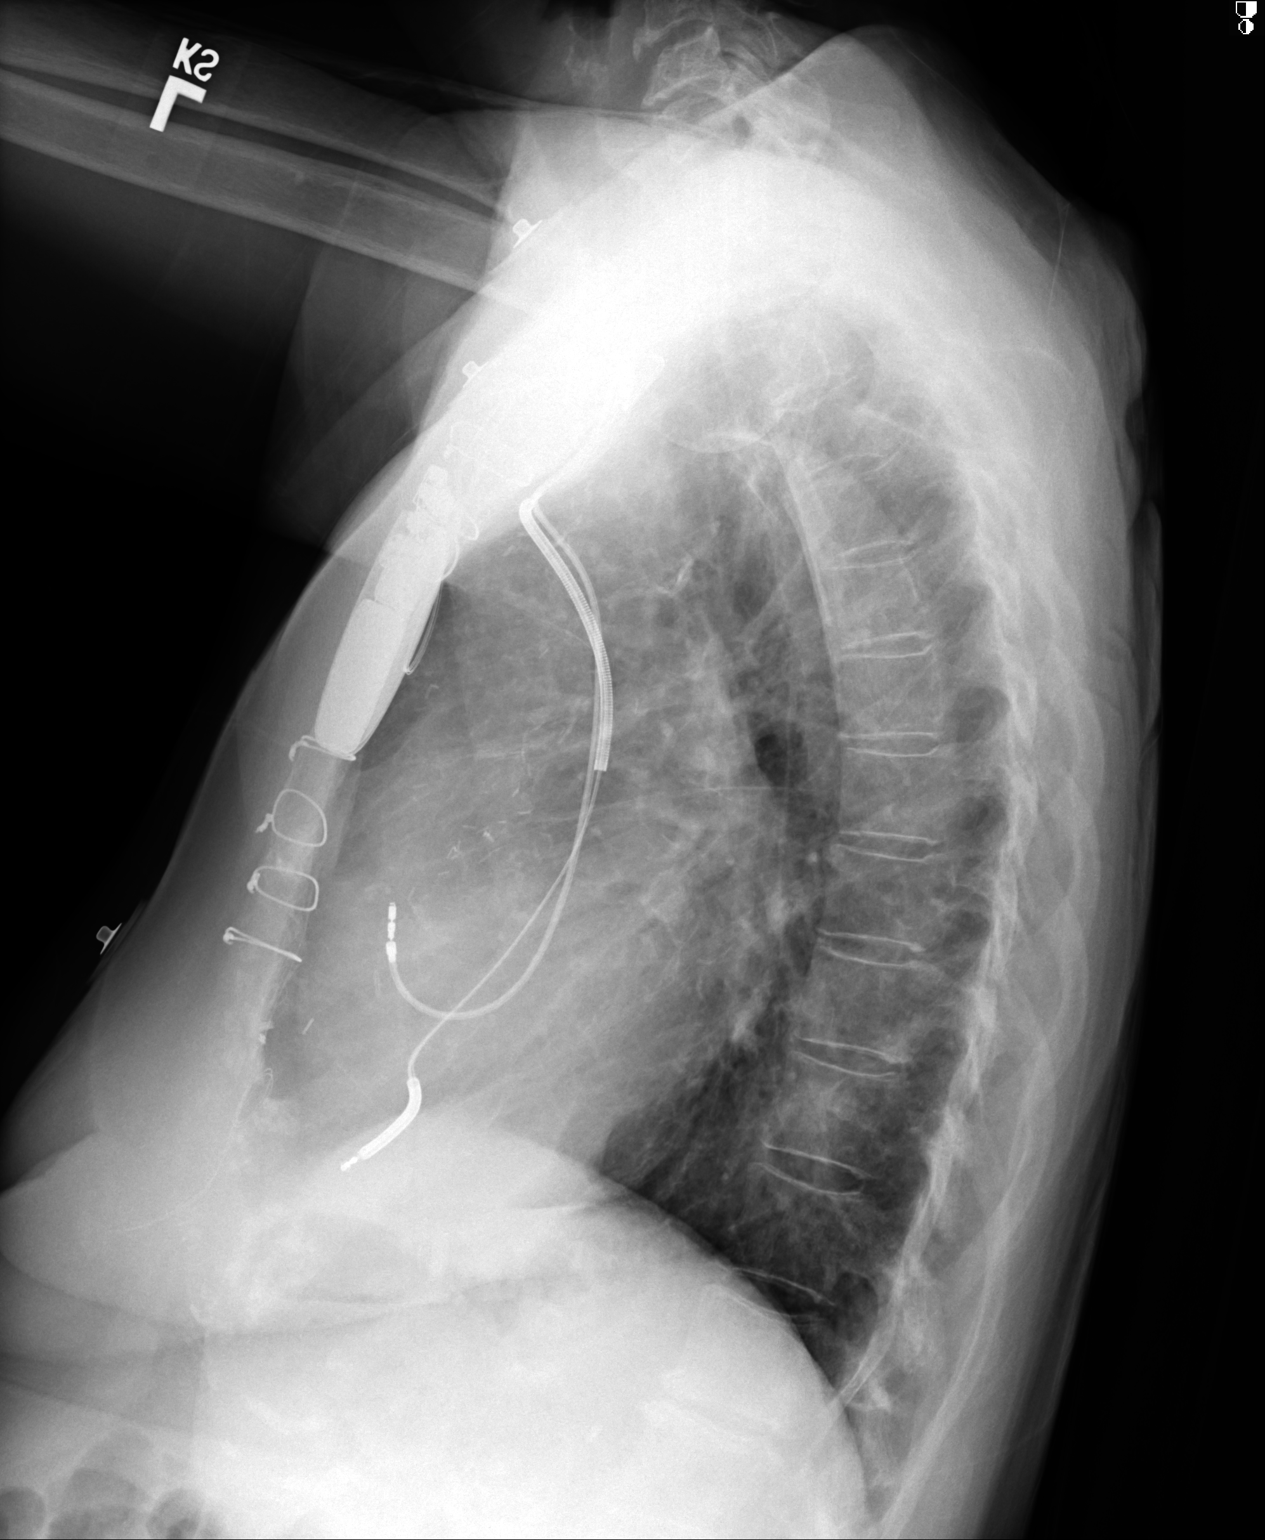
[im 2/2]
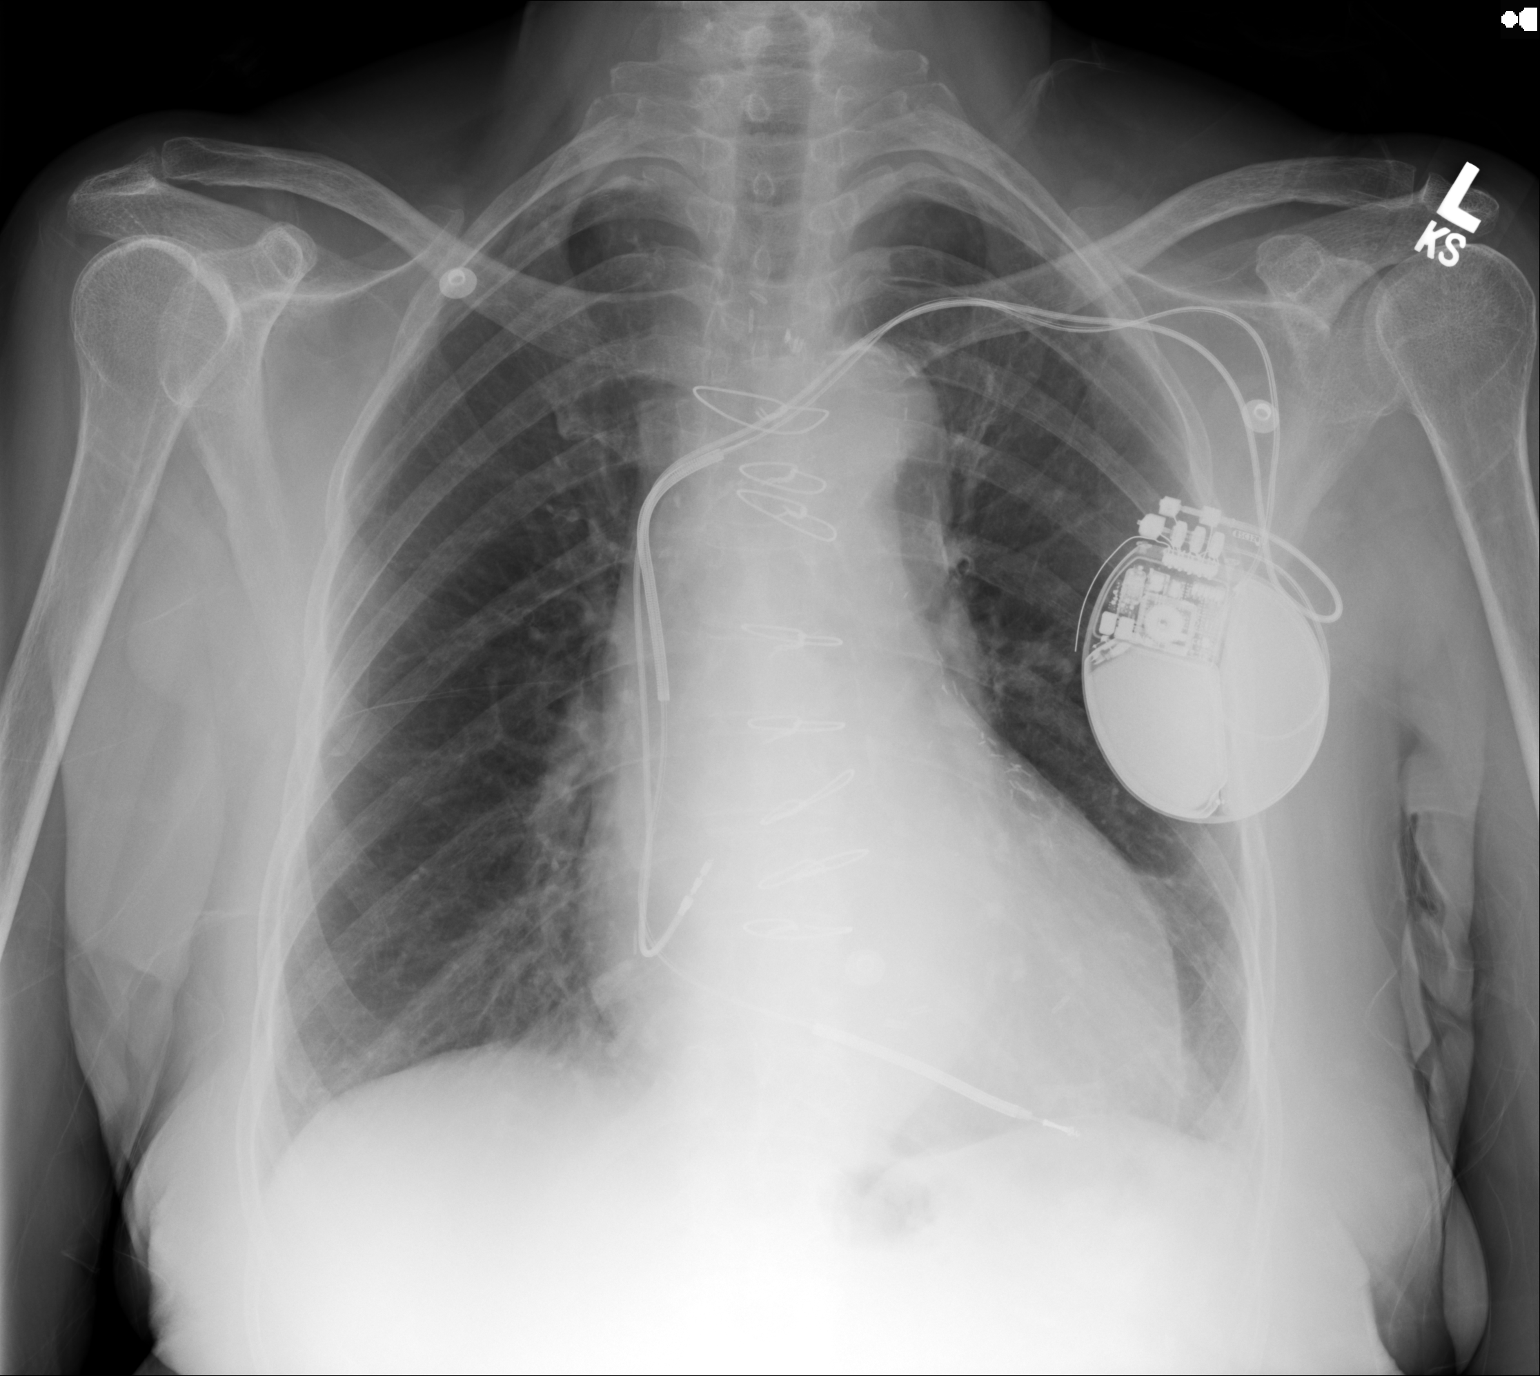

[2 of 2 positions shown; findings below may reference images not displayed]

PROCEDURE:     DXR - DXR CHEST PA (OR AP) AND LATERAL  - October 27, 2011 [DATE]

RESULT:     Comparison is made to the study March 10, 2011.

The lungs are well-expanded. There is no focal infiltrate. The cardiac
silhouette is mildly enlarged. The pulmonary vascularity is not engorged. A
permanent pacemaker defibrillator is present. The patient has undergone
previous median sternotomy. The bony thorax appears intact.
IMPRESSION: There is no evidence of acute cardiopulmonary abnormality.

[REDACTED]

## 2014-06-13 NOTE — H&P (Signed)
PATIENT NAME:  Sally Noble, Sally Noble MR#:  [id number] DATE OF BIRTH:  [birth year]  DATE OF ADMISSION:  03/08/2011  REFERRING PHYSICIAN: ER physician, Dr. Carollee MassedKaminski.   PRIMARY CARE PHYSICIAN:   The patient reports that her primary care physician is at [facility]. However, she has a DO NOT RESUSCITATE form signed by Dr. Ronna PolioJennifer Walker.   CHIEF COMPLAINT: Chest pain.   HISTORY OF PRESENT ILLNESS: The patient is an 81 year old female who is a poor historian. She says she has multiple medical problems, but she does not remember all of them. She is a resident of [facility], an assisted living facility. She has been at that facility since June 2012. She was brought to the ED with complaint of chest pain. The patient received some nitroglycerin by EMS and is currently chest pain free. She reports feeling weak, fatigued. She reports she has poor appetite because she does not like the food at [facility]. She also reports that she has been having diarrhea 3 to 4 times a week for a long period of time. She has also been nauseous and had one episode of vomiting recently. The patient was found to be hypotensive with acute renal failure on admission.   PAST MEDICAL HISTORY:  The patient reports that she has multiple medical problems, but she does not remember all of them. As per review of ED record, she has a history of hypertension, hyperlipidemia, chronic obstructive pulmonary disease, gastroesophageal reflux disease, congestive heart failure, coronary artery disease, status post bypass, pacemaker/defibrillator placement.   PAST SURGICAL HISTORY: The patient reports that she has had surgeries in the past, but does not remember them. She has a coronary artery bypass graft scar.   ALLERGIES: No known drug allergies.   MEDICATIONS:  1. Tylenol/codeine 300/30 every 4 to 6 hours p.r.n. pain. 2. Ancef 5 mg at bedtime. 3. Aspirin 81 mg daily.  4. Lipitor 80 mg daily.  5. Folic acid 0.4 mg daily.  6. Lasix 40 mg  daily.  7. Lisinopril 20 mg daily.  8. Toprol XL 25 mg daily.  9. Nitroglycerin p.r.n.  10. Omeprazole 20 mg b.i.d.  11. Senna Plus 50/8.6 mg p.r.n.  12. Spironolactone 25 mg daily.   SOCIAL HISTORY: The patient reports that she has been a resident [address] since July 2012. Before that she was living at home. She denies any history of smoking, alcohol or drug abuse. She has two children. The patient's daughter lives in [place] and is primarily involved in her care. The patient's son lives in [place].   FAMILY HISTORY: The patient reports that both her parents were healthy. She denies any history of coronary artery disease, heart disease, cancer.   REVIEW OF SYSTEMS: CONSTITUTIONAL: The patient denies any recent weight changes or fever. Reports weakness and fatigue. EYES: Denies any blurred or double vision. She has a left eye prosthesis. ENT: Denies any tinnitus, ear pain. RESPIRATORY: Denies any painful respiration, wheezing, or cough. CARDIOVASCULAR: Has reported chest pain earlier, however, currently is chest pain free. Denied any tachycardia or palpitations. GASTROINTESTINAL: Reported nausea, vomiting, diarrhea, denied any abdominal pain. GU: Denies any nocturia or pyuria. MUSCULOSKELETAL: Denies any joint pain. INTEGUMENT: Denies any acne, rash or eruptions. NEUROLOGICAL: Denies any seizures, paralysis or tingling. PSYCH: Denies any history of insomnia, headache depression. ENDOCRINE: Denies any thyroid problems, heat or cold intolerance. HEME/LYMPH: Denies any history of anemia or easy bruisability.   PHYSICAL EXAMINATION:  VITAL SIGNS: Temperature 96.5, heart rate 62,  respiratory rate 16, blood pressure 68/47, and pulse oximetry 98% on room air.   GENERAL: The patient is an elderly, chronically ill-appearing female lying comfortably in bed, not in acute distress. She appears very weak and tired.   HEAD: Atraumatic, normocephalic.   EYES: The patient's left eye is a  prosthetic eye and is having a lot of watering. Right eye:  Pupil is equal, round, and reactive to light and accommodation. Extraocular movements intact.   ENT: Dry mucous membranes. No oropharyngeal erythema or thrush.   NECK: Supple. No masses. No JVD. No thyromegaly or lymphadenopathy.   CHEST WALL: No tenderness to palpation. Not using accessory muscles of respiration. No intercostal muscle retractions.   LUNGS: Bilaterally clear to auscultation. No wheezing, rales or rhonchi.   CARDIOVASCULAR: S1, S2 regular. There is a systolic murmur. No rubs or gallops.   ABDOMEN: Soft, nontender, nondistended. No guarding or rigidity. No organomegaly. Normal bowel sounds.   SKIN: No rashes or lesions.   PERIPHERY: No pedal edema, 1+ pedal pulses.   MUSCULOSKELETAL: No cyanosis or clubbing.   NEUROLOGIC: Awake, alert, oriented x3, although the patient is slow to respond. Cranial nerves grossly intact. Moves all four extremities. Generalized weakness.   PSYCH: Normal mood and affect.   LABORATORY, DIAGNOSTIC, AND RADIOLOGICAL DATA: CK and troponin normal. White count 6.4, hemoglobin 10, hematocrit 33.2. The patient's initial CMP showed glucose 110, creatinine of 3.84, BUN 107, CO2 19 with potassium of around 7, but there was hemolysis. Therefore, the lab is running her complete metabolic panel again. Chest x-ray shows cardiomegaly. No other abnormalities.   ASSESSMENT AND PLAN: An 81 year old female with history of hypertension, hyperlipidemia, and coronary artery disease status post coronary artery bypass graft who presents with chest pain.  1. Chest pain. Appears to be very atypical. Her EKG is nondiagnostic because it is paced. Her first set of troponin is negative. Will check serial cardiac enzymes and continue aspirin for the time being.  2. Hypotension. The patient's blood pressure was 68/44 on presentation. She has gotten a liter of fluid. Her pressure is a little bit better. We will  continue IV fluid hydration and hold all her antihypertensive medications.  3. Acute renal failure. The patient's initial CMP showed a BUN of 107 and creatinine of 3.84. Due to hemolysis, it is being run again. Review of records show that her renal function was normal in August 2012. The patient reports poor appetite. She does not like the food at [facility]. Will hydrate the patient with IV fluids, monitor strict ins and outs. Avoid nephrotoxic medications. We will hold the patient's Lasix, lisinopril and Aldactone. We will obtain a nephrology consultation and a renal ultrasound. We will also try and get medical records from [facility].  4. Anemia. The patient's current hemoglobin is 11. In October her hemoglobin was 14.5, which was normal range. We will check stool guaiacs and iron studies.  5. Hyperglycemia, possibly reactive. The patient has no history of diabetes.  6. Acidosis. This is likely related to hypotension and acute renal failure. Will hydrate with IV fluids and monitor closely.  7. Hyperkalemia. The patient's initial CMP showed a potassium of 7.0. However, there was hemolysis. Therefore, the lab is running the complete metabolic panel again.  Will review the CMP and treat accordingly.  8. Diarrhea. The patient reports diarrhea for a long period of time. We will hold the patient's stool softener. Check stool studies. Hydrate with IV fluids.  9. Hyperlipidemia. Will continue  the patient's statin and check fasting lipid profile.  10. Possible mild underlying dementia. We will continue the patient's Aricept.  11. Will place on gastrointestinal and deep vein thrombosis prophylaxis.  12. CODE STATUS:  The patient has an out of facility DO NOT RESUSCITATE signed Dr. Ronna Polio in her chart. However, pt/ family want to be full code  Reviewed all medical records. Discussed with ER physician.   TIME SPENT: 75 minutes.   ____________________________ Darrick Meigs,  MD sp:ap D: 03/08/2011 14:07:31 ET           T: 03/08/2011 14:47:27 ET JOB#: [id number] cc: Darrick Meigs, MD, <Dictator> Ginette Pitman. Dan Humphreys, MD Darrick Meigs MD ELECTRONICALLY SIGNED 03/08/2011 18:00

## 2014-06-13 NOTE — Discharge Summary (Signed)
PATIENT NAME:  Juanita CraverBOWDEN, Jareli MR#:  [id number] DATE OF BIRTH:  1923/09/13  DATE OF ADMISSION:  03/08/2011 DATE OF DISCHARGE:  03/13/2011  ADMITTING DIAGNOSES: Chest pain, diarrhea, acute renal failure.   DISCHARGE DIAGNOSES:  1. Chest pain with known coronary artery disease which medical management was [facility] at [facility] cardi[facility]. Again cardiology recommended medical management based on her age, previous recommendations.  2. Hypotension, possibly related to diuretic therapy as well as concurrent diarrhea, now resolved.  3. Acute renal failure due to volume depletion, now resolved with intravenous hydration.  4. Diarrhea of unclear cause, now resolved. Patient may need further evaluation, gastrointestinal evaluation if continues to have problem with diarrhea. She had diarrhea on and off for the past six months.  5. Hyperkalemia on presentation with subsequent hypokalemia with treatment, now potassium is normal.  6. Bradycardia noted during hospitalization. Patient's beta blockers have been stopped.  7. History of congestive heart failure with significantly depressed ejection fraction of 20% to 25% with multiple areas of hypokinesis consistent with ischemic heart disease.  8. Elevated d-dimer with intermediate V/Q scan, however, computed tomography per pulmonary embolus protocol was negative for pulmonary embolus.   9. History of chronic obstructive pulmonary disease without acute exacerbation. 10. Gastroesophageal reflux disease. 11. Status post bypass. 12. Status post pacemaker defibrillator.   CONSULTANT: Dr. Mariah MillingGollan.   LABORATORY, DIAGNOSTIC AND RADIOLOGICAL DATA: Admitting BMP: Glucose 214, BUN 49, creatinine 1.77, sodium 145, potassium 3.3, CO2 112, CO2 17, calcium 7.7, hemoglobin A1c 6.5. Fasting lipid panel: Total cholesterol 115, triglycerides 135, HDL 25, LDL 63. Troponin 0.03, 0.02. Cortisol level was normal. Echocardiogram showed ejection fraction of 25% to 30%, multiple areas of  hypokinesis.   HOSPITAL COURSE: Please see history and physical done by the admitting physician. Patient is an 81 year old white female with history of coronary artery disease, hypertension, hyperlipidemia, chronic obstructive pulmonary disease, gastroesophageal reflux disease, previous history of congestive heart failure who was brought to the ED with complaint of chest pain. Patient was also noted to be in acute renal failure. She complained of diarrhea. Also had hypotension. Patient had to be placed in the unit and had to be kept on pressors until blood pressure improved. Patient was also hydrated. With these interventions her blood pressure normalized. In terms of her chest pain cardiac enzymes remained negative, however, patient did have chest pain on presentation. She was seen in consultation by Dr. Mariah MillingGollan. He [facility] where she had a catheterization which showed that she had to arteries that were obstructive and the options these were possible intervention but based on her age and anatomy they deferred to medical management. Patient was continued on her medications, however, medications were limited because of her hypotension. Patient was on a beta blocker that had to be discontinued due to bradycardia. So at this time she is being treated with aspirin, p.r.n. nitroglycerin. She will need to [facility] to decide if any other further intervention needs to be done. In terms of her hypotension, it was felt likely due to her diarrhea and her diuretic therapy hypotension is resolved with IV hydration. Patient was also noted to be in acute renal failure, treated with IV fluids and her renal functions normalized. Patient currently is stable and has been cleared by cardiology to be discharged. At this time she is stable for discharge.   DISCHARGE MEDICATIONS:  1. Atorvastatin 80 mg daily.  2. Aricept 5 daily.  3. Folic acid 0.4 mg  daily.  4. Aspirin 81, 1 tab  p.o. daily.  5. Nitroglycerin 0.4 sublingual p.r.n.  6. Omeprazole 20, 1 tab p.o. b.i.d.  7. Acetaminophen codeine 300/30, 1 tab p.o. every 4 to 6 p.r.n.  8. Imodium 1 to 2 tabs for loose bowel movements q.8 hours p.r.n.  9. Patient is told not to take the Lasix, spironolactone, Toprol- XL and senna.   DISCHARGE DIET: Low sodium diet.   ACTIVITY: As tolerated.   REFERRAL: To resume Heart Track program.   TIMEFRAME FOR FOLLOW UP: 1 to 2 weeks with [facility] Kendell Bane, primary M.D., primary cardiology in 2 to 4 weeks.   TIME SPENT: 35 minutes.   ____________________________ Lacie Scotts Allena Katz, MD shp:cms D: 03/13/2011 14:01:12 ET T: 03/14/2011 11:13:27 ET JOB#: [id number]  cc: Rakel Junio H. Allena Katz, MD, <Dictator>  Charise Carwin MD ELECTRONICALLY SIGNED 04/07/2011 9:56

## 2014-06-13 NOTE — Consult Note (Signed)
Chief Complaint:   Subjective/Chief Complaint Patient does not have chest pain this AM; denies shortness of breath   VITAL SIGNS/ANCILLARY NOTES: **Vital Signs.:   20-Jan-13 11:52   Vital Signs Type Upon Transfer   Temperature Temperature (F) 98   Celsius 36.6   Temperature Source oral   Pulse Pulse 59   Pulse source per Dinamap   Respirations Respirations 18   Systolic BP Systolic BP 563   Diastolic BP (mmHg) Diastolic BP (mmHg) 75   Mean BP 88   BP Source Dinamap   Pulse Ox % Pulse Ox % 100   Pulse Ox Activity Level  At rest   Oxygen Delivery 2L   Brief Assessment:   Cardiac Regular    Respiratory clear BS    Gastrointestinal Normal    Additional Physical Exam Confused ext - no edema   Cardiac:  17-Jan-13 12:12    Troponin I < 0.02    13:50    Troponin I < 0.02    22:03    Troponin I 0.03  18-Jan-13 06:40    Troponin I 0.03  Routine Chem:  20-Jan-13 01:03    Glucose, Serum 136   BUN 21   Creatinine (comp) 1.06   Sodium, Serum 145   Potassium, Serum 3.1   Chloride, Serum 111   CO2, Serum 21   Calcium (Total), Serum 7.4  Hepatic:  20-Jan-13 01:03    Albumin, Serum 2.2  Routine Chem:  20-Jan-13 01:03    Osmolality (calc) 294   eGFR (African American) >60   eGFR (Non-African American) 52   Anion Gap 13   Phosphorus, Serum 1.9   Radiology Results: Cardiology:    18-Jan-13 08:11, Echo Doppler   Echo Doppler    Interpretation Summary    The left ventricle is normal in size. Ejection Fraction = 25-35%. The   transmitral spectral Doppler flow pattern is suggestive of impaired   LV relaxation. Left ventricular systolic function is moderately   reduced. Thereis moderate anterior wall hypokinesis. There is   moderate septal hypokinesis. There is moderate inferior wall   hypokinesis. Septal motion is consistent with post-operative state.   There is a pacemaker lead in the right ventricle. The right   ventricular systolic function is normal. The left  atrium is mildly   dilated. There is mild tricuspid regurgitation. Right ventricular   systolic pressure is elevated at 30-15mHg.    Procedure:    A two-dimensional transthoracic echocardiogram with color flow and   Doppler was performed.    Left Ventricle    There is moderate anterior wall hypokinesis.    There is moderate inferior wall hypokinesis.    Septal motion is consistent with post-operative state.    Ejection Fraction = 25-35%.    The transmitral spectral Doppler flow pattern is suggestive of   impaired LV relaxation.    There is moderate septal hypokinesis.    The left ventricle is normal in size.    There is mild concentric left ventricular hypertrophy.    Left ventricular systolic function is moderately reduced.    Right Ventricle    There is a pacemaker lead in the right ventricle.    The right ventricular systolic function is normal.    Atria    The left atrium is mildly dilated.    Right atrial size is normal.    There is no Doppler evidence for an atrial septal defect.    Mitral Valve    The mitral valve leaflets  appear normal. There is no evidence of   stenosis, fluttering, or prolapse.    There is no mitral valve stenosis.    There is mild mitral regurgitation.    Tricuspid Valve    The tricuspid valve is normal.    There is mild tricuspid regurgitation.    Right ventricular systolic pressure is elevated at 30-36mHg.    Aortic Valve    The aortic valve opens well.    No aortic regurgitation is present.    No hemodynamically significant valvular aortic stenosis.    Pulmonic Valve    The pulmonic valve is not well visualized.    Trace pulmonic valvular regurgitation.    Great Vessels    The aortic root is normal size.    The pulmonary artery is not well visualized,but is probably normal   size.    Pericardium/Pleural    There is no pleural effusion.    No pericardial effusion.    MMode 2D Measurements and Calculations    RVDd: 3.3 cm     IVSd: 1.2 cm    LVIDd: 4.5 cm    LVIDs: 3.7 cm    LVPWd: 1.1 cm    FS: 19 %    EF(Teich): 39 %    Ao root diam: 2.8 cm    LA dimension: 4.6 cm    LVOT diam: 2.0 cm    Doppler Measurements and Calculations    MV E point: 65 cm/sec    MV A point: 118 cm/sec    MV E/A: 0.55     MV dec time: 0.29 sec    Ao V2 max: 178 cm/sec  Ao max PG: 13 mmHg    AVA(V,D): 2.4 cm2    LV max PG: 7.0 mmHg    LV V1 max: 134 cm/sec    PA V2 max: 157 cm/sec    PA max PG: 10 mmHg    PI end-d vel: 91 cm/sec    TR Max vel: 254 cm/sec    TR Max PG: 26 mmHg    RVSP: 31 mmHg    RAP systole: 5.0 mmHg    Reading Physician: GIda Rogue  Sonographer: HSherrie Sport Interpreting Physician:  TIda Rogue  electronically signed on   03-09-2011 17:54:00  Requesting Physician: GIda Rogue Nuclear Med:    19-Jan-13 11:51, Lung VQ Scan - Nuc Med   Lung VQ Scan - Nuc Med    REASON FOR EXAM:    chest pain hypotension  COMMENTS:       PROCEDURE: NM  - NM VQ LUNG SCAN  - Mar 10 2011 11:51AM     RESULT: Following administration of 37.8 mCi of technetium 967mTPA and 4   mCi of technetium 9997mA ventilation perfusion scan obtained. Comparison   made to chest x-ray of 03/08/2011. Tiny patchy ventilation perfusion   matches are noted. This is an indeterminate scan for pulmonary embolus.    IMPRESSION:  Indeterminant scan pulmonary embolus.          Verified By: THOOsa Craver.D., MD   Assessment/Plan:  Assessment/Plan:   Assessment 1) chest pain - enzymes negative; not a candidate for aggressive cardiac evaluation; continue ASA and statin. 2) ischemic cardiomyopathy - patient's dopamine has been weaned; resume low dose toprol and lisinopril later as her BP allows (renal function has improved). 3) renal insufficiency - diuretics DCed; follow volume status; may need lower dose of diuretic at DC 4) Dementia 5) CAD - continue ASA  and statin   Electronic Signatures: Kirk Ruths (MD)   (Signed 20-Jan-13 12:37)  Authored: Chief Complaint, VITAL SIGNS/ANCILLARY NOTES, Brief Assessment, Lab Results, Radiology Results, Assessment/Plan   Last Updated: 20-Jan-13 12:37 by Kirk Ruths (MD)

## 2014-06-13 NOTE — Consult Note (Signed)
General Aspect 81 year old female with CAD, CABG, presenting with chest pain, renal failure adn hypotension. Cardiology was consulted for chest pain. She is a poor historian and most of the details are obtained from the notes.   She is a resident of [place], since June 2012.  poor po intake ("I do not like the food").  She received some nitroglycerin by EMS and is currently chest pain free. She reports feeling weak, fatigued. She also reports that she has been having diarrhea 3 to 4 times a week for a long period of time, rare nauseous and had one episode of vomiting recently.  In the ER,  The patient was found to be hypotensive with acute renal failure.  Normal cardiac enz with paced rhythm on EKG. last seen by cardiology at [facility] 01/19/2011 (stable)  ECHO 10/19/10 moderately dilated LV, EF 15 to 20%, global HK    Present Illness . PMH: CAD CABG with LIMA to teh LAD, SVG to PDA, SVG to OM in 2002 Cath 09/2010 SVG to OM was occluded, EF 15 to 20% Dementia,  HTN CVA Left eye blind ICD  SOCIAL HISTORY:  resident of [place] since July 2012.  She denies any history of smoking, alcohol or drug abuse. She has two children. The patient's daughter lives in [place] and is primarily involved in her care. The patient's son lives in [place].   FAMILY HISTORY: The patient reports that both her parents were healthy. She denies any history of coronary artery disease, heart disease, cancer.   Physical Exam:   GEN cachectic, thin, Dry    HEENT red conjunctivae    NECK supple    RESP normal resp effort  clear BS    CARD Regular rate and rhythm  Murmur    Murmur Systolic    Systolic Murmur Out flow    ABD denies tenderness  soft    LYMPH negative neck    EXTR negative edema    SKIN normal to palpation    NEURO motor/sensory function intact    PSYCH alert, A+O to time, place, person, poor insight   Review of Systems:   Subjective/Chief Complaint Tired,  "wants to go home"    General: No Complaints    Skin: No Complaints    ENT: No Complaints    Eyes: No Complaints    Neck: No Complaints    Respiratory: No Complaints    Cardiovascular: No Complaints    Gastrointestinal: No Complaints    Genitourinary: No Complaints    Vascular: No Complaints    Musculoskeletal: No Complaints    Neurologic: No Complaints    Hematologic: No Complaints    Endocrine: No Complaints    Psychiatric: No Complaints    Review of Systems: All other systems were reviewed and found to be negative    Medications/Allergies Reviewed Medications/Allergies reviewed     mem:    COPD:    Hypertension:    Hypertension:    GERD - Esophageal Reflux:    Hyperlipidemia:    by:    CHF:    CAD:        Admit Diagnosis:   ACUTE RENAL FAILURE HYPOTENSION ANEMA: 09-Mar-2011, Active, ACUTE RENAL FAILURE HYPOTENSION ANEMA   CAD: Active, CAD      Admit Reason:   Hypertension: (401.9) Active, ICD9, Unspecified essential hypertension  Home Medications:  atorvastatin 80 mg oral tablet: 1 tab(s) orally once a day., Active  Metoprolol Succinate ER 25 mg oral  tablet, extended release: 1 tab(s) orally once a day., Active  furosemide 40 mg oral tablet: 1 tab(s) orally once a day., Active  Aricept 5 mg oral tablet: 1 tab(s) orally once a day (at bedtime), Active  lisinopril 20 mg oral tablet: 1 tab(s) orally once a day., Active  spironolactone 25 mg oral tablet: 1 tab(s) orally once a day., Active  Senna Plus 50 mg-8.6 mg oral tablet: 1 tab(s) orally as needed, Active  folic acid 0.4 mg oral tablet: 1 tab(s) orally once a day., Active  Aspirin Low Dose 81 mg oral tablet: 1 tab(s) orally once a day, Active  nitroglycerin 0.4 mg sublingual tablet: 1 tab(s) sublingual every 5 minutes up to 3 times as needed for chest pain. *if no relief call md or go to emergency room**, Active  acetaminophen-codeine 300 mg-30 mg oral tablet: 1 tab(s) orally every 4 to 6  hours as needed for pain., Active  omeprazole 20 mg oral delayed release capsule: 1 cap(s) orally 2 times a day as needed for gerd., Active    Cardiac:  17-Jan-13 12:12    Troponin I < 0.02   CK, Total -   CPK-MB, Serum -  Routine Hem:  17-Jan-13 12:12    WBC (CBC) 6.4   RBC (CBC) 3.56   Hemoglobin (CBC) 11.0   Hematocrit (CBC) 33.2   Platelet Count (CBC) 159   MCV 93   MCH 30.8   MCHC 33.0   RDW 13.6  Routine Chem:  17-Jan-13 12:12    Glucose, Serum -   BUN -   Creatinine (comp) -   Sodium, Serum -   Potassium, Serum -   Chloride, Serum -   CO2, Serum -   Calcium (Total), Serum -  Hepatic:  17-Jan-13 12:12    Bilirubin, Total -   Alkaline Phosphatase -   SGPT (ALT) -   SGOT (AST) -   Total Protein, Serum -   Albumin, Serum -  Routine Chem:  17-Jan-13 12:12    Osmolality (calc) -   eGFR (African American) -   eGFR (Non-African American) -   Anion Gap -  Cardiac:  17-Jan-13 13:50    Troponin I < 0.02   CK, Total 59   CPK-MB, Serum 2.4  Routine Chem:  17-Jan-13 13:50    Glucose, Serum 99   BUN 101   Creatinine (comp) 3.46   Sodium, Serum 141   Potassium, Serum 6.7   Chloride, Serum 110   CO2, Serum 19   Calcium (Total), Serum 8.3  Hepatic:  17-Jan-13 13:50    Bilirubin, Total 0.4   Alkaline Phosphatase 57   SGPT (ALT) 18   SGOT (AST) 20   Total Protein, Serum 6.8   Albumin, Serum 3.3  Routine Chem:  17-Jan-13 13:50    Osmolality (calc) 313   eGFR (African American) 16   eGFR (Non-African American) 13   Anion Gap 12   EKG:   Interpretation EKG shows paced rhythm with rate of 60 bpm   Radiology Results: XRay:    17-Jan-13 11:54, Chest Portable Single View   Chest Portable Single View    REASON FOR EXAM:    Chest Pain  COMMENTS:       PROCEDURE: DXR - DXR PORTABLE CHEST SINGLE VIEW  - Mar 08 2011 11:54AM     RESULT: The heart is mildly enlarged. Electronic device suggestive of a   pacemaker is present over the left chest. Two leads  are in position. A  defibrillating unit could give this appearance. Is no edema, infiltrate,   effusion or pneumothorax. Sternotomy wires are present. The cardiac   silhouette is mildly enlarged.    IMPRESSION:  Cardiomegaly.          Verified By: Sundra Aland, M.D., MD  [place]:    18-Jan-13 10:01, US Kidney Bilateral   US Kidney Bilateral    REASON FOR EXAM:    acute renal failure  COMMENTS:       PROCEDURE: [place]  - US KIDNEY  - Mar 09 2011 10:01AM     RESULT: Indication: Acute renal failure    Comparison: None    Technique and findings: Multiple gray-scale and color doppler images of   the kidneys were obtained.     The right kidney measures 9.6 x 4.5 x 4.4 cm and the left kidney measures   10.4 x 5 x 5.7 cm. The kidneys are normal in echogenicity. There is no   hydronephrosis.  There are no echogenic foci.  There is a left upper pole    renal mass measuring 10 mm with increased to transmission in the   inferoseptal wall most consistent with a cyst. There is no free fluid in   the region of the renal fossa.    IMPRESSION:      1. No obstructive uropathy.    2. Left renal cyst.          Verified By: Jennette Banker, M.D., MD    Demerol HCl: Dizzy/Fainting  Vital Signs/Nurse's Notes: **Vital Signs.:   18-Jan-13 04:54   Vital Signs Type Routine   Temperature Temperature (F) 98.2   Celsius 36.7   Temperature Source oral   Pulse Pulse 71   Pulse source per Dinamap   Respirations Respirations 20   Systolic BP Systolic BP 659   Diastolic BP (mmHg) Diastolic BP (mmHg) 66   Mean BP 77   BP Source Dinamap   Pulse Ox % Pulse Ox % 96   Pulse Ox Activity Level  At rest   Oxygen Delivery Room Air/ 21 %     Impression 81 year old female with CAD, CABG, other medical issues, dementia,  poor historian, presenting with chest pain, dehydration/renal failure, hypotension.  A/P: 1) Chest pain No significant change on EKG Cardiac enz negative Currently with no  chest pain Recent cath 8/12 with medical management recommended. ---Would continue out pt meds. ---NTG PRN for chest pain. --Follow up at [facility] [place] as an outpt.  2) Dehydration: possible depression? No eating or drinking much Consider medication for depression. Hold lasix and spironolactone as an outpt  3) Systolic CHF appears dehydrated.  With minimal PO intake, would hold lasix and spironolactone   Electronic Signatures: Ida Rogue (MD)  (Signed 18-Jan-13 22:15)  Authored: General Aspect/Present Illness, History and Physical Exam, Review of System, Past Medical History, Health Issues, Home Medications, Labs, EKG , Radiology, Allergies, Vital Signs/Nurse's Notes, Impression/Plan   Last Updated: 18-Jan-13 22:15 by Ida Rogue (MD)

## 2014-06-13 NOTE — Consult Note (Signed)
Chief Complaint:   Subjective/Chief Complaint Patient does not have chest pain this AM and does not recall having any yesterday; denies shortness of breath   VITAL SIGNS/ANCILLARY NOTES: **Vital Signs.:   19-Jan-13 09:00   Vital Signs Type Routine   Temperature Source tympanic   Pulse Pulse 60   Pulse source per cardiac monitor   Respirations Respirations 14   Systolic BP Systolic BP 875   Diastolic BP (mmHg) Diastolic BP (mmHg) 49   Mean BP 67   Pulse Ox % Pulse Ox % 100   Oxygen Delivery 2L   Pulse Ox Heart Rate 60   Brief Assessment:   Cardiac Regular    Respiratory clear BS    Gastrointestinal Normal    Additional Physical Exam Confused ext - no edema   Cardiac:  18-Jan-13 06:40    Troponin I 0.03   CK, Total 59   CPK-MB, Serum 2.0  Routine Hem:  18-Jan-13 06:40    WBC (CBC) 9.4   RBC (CBC) 3.48   Hemoglobin (CBC) 10.8   Hematocrit (CBC) 32.4   Platelet Count (CBC) 151   MCV 93   MCH 31.1   MCHC 33.3   RDW 13.6  Routine Chem:  18-Jan-13 06:40    Glucose, Serum 123   BUN 68   Creatinine (comp) 2.04   Sodium, Serum 150   Potassium, Serum 4.2   Chloride, Serum 120   CO2, Serum 20   Calcium (Total), Serum 8.6   Osmolality (calc) 319   eGFR (African American) 30   eGFR (Non-African American) 25   Anion Gap 10   Iron Binding Capacity (TIBC) 248   Unbound Iron Binding Capacity 181   Iron, Serum 67   Iron Saturation 27   Ferritin ([facility]) 394  Routine Hem:  18-Jan-13 06:40    Neutrophil % 96.3   Lymphocyte % 2.8   Monocyte % 0.6   Eosinophil % 0.3   Basophil % 0.0   Neutrophil # 9.0   Lymphocyte # 0.3   Monocyte # 0.1   Eosinophil # 0.0   Basophil # 0.0  Routine Chem:  18-Jan-13 06:40    Magnesium, Serum 1.4   Hemoglobin A1c ([facility]) 6.5   Cholesterol, Serum 115   Triglycerides, Serum 135   HDL (INHOUSE) 25   VLDL Cholesterol Calculated 27   LDL Cholesterol Calculated 63  Routine Coag:  19-Jan-13 09:28    Activated PTT (APTT) > 160.0    D-Dimer, Quantitative 3.98  Routine Hem:  19-Jan-13 09:28    Erythrocyte Sed Rate 59   Radiology Results: XRay:    17-Jan-13 11:54, Chest Portable Single View   Chest Portable Single View    REASON FOR EXAM:    Chest Pain  COMMENTS:       PROCEDURE: DXR - DXR PORTABLE CHEST SINGLE VIEW  - Mar 08 2011 11:54AM     RESULT: The heart is mildly enlarged. Electronic device suggestive of a   pacemaker is present over the left chest. Two leads are in position. A   defibrillating unit could give this appearance. Is no edema, infiltrate,   effusion or pneumothorax. Sternotomy wires are present. The cardiac   silhouette is mildly enlarged.    IMPRESSION:  Cardiomegaly.          Verified By: Sundra Aland, M.D., MD   Assessment/Plan:  Assessment/Plan:   Assessment 1) chest pain - enzymes negative; not a candidate for aggressive cardiac evaluation; continue ASA and statin. 2)  ischemic cardiomyopathy - patient's dopamine is being weaned; resume low dose toprol and lisinopril later as her dehydration and BP improve (as renal function improves and patient off dopamine). 3) renal insufficiency - diuretics on hold; gentle hydration; watch volume status given severity of LV dysfunction. 4) Dementia 5) CAD - continue ASA and statin   Electronic Signatures: Kirk Ruths (MD)  (Signed 19-Jan-13 10:57)  Authored: Chief Complaint, VITAL SIGNS/ANCILLARY NOTES, Brief Assessment, Lab Results, Radiology Results, Assessment/Plan   Last Updated: 19-Jan-13 10:57 by Kirk Ruths (MD)

## 2015-11-20 DEATH — deceased
